# Patient Record
Sex: Male | Born: 1950
Health system: Southern US, Community
[De-identification: ages and names within clinical notes are randomized; demographics above are authoritative.]

## PROBLEM LIST (undated history)

## (undated) DIAGNOSIS — E7800 Pure hypercholesterolemia, unspecified: Secondary | ICD-10-CM

## (undated) DIAGNOSIS — G43909 Migraine, unspecified, not intractable, without status migrainosus: Secondary | ICD-10-CM

## (undated) DIAGNOSIS — Z87442 Personal history of urinary calculi: Secondary | ICD-10-CM

## (undated) DIAGNOSIS — I10 Essential (primary) hypertension: Secondary | ICD-10-CM

## (undated) HISTORY — PX: LITHOTRIPSY: SUR834

## (undated) HISTORY — PX: HERNIA REPAIR: SHX51

---

## 2001-01-26 ENCOUNTER — Encounter: Payer: Self-pay | Admitting: Emergency Medicine

## 2001-01-26 ENCOUNTER — Emergency Department (HOSPITAL_COMMUNITY): Admission: EM | Admit: 2001-01-26 | Discharge: 2001-01-26 | Payer: Self-pay | Admitting: Emergency Medicine

## 2007-12-26 ENCOUNTER — Encounter: Admission: RE | Admit: 2007-12-26 | Discharge: 2007-12-26 | Payer: Self-pay | Admitting: Family Medicine

## 2015-03-01 ENCOUNTER — Other Ambulatory Visit: Payer: Self-pay | Admitting: Urology

## 2015-03-01 DIAGNOSIS — N50811 Right testicular pain: Secondary | ICD-10-CM

## 2015-03-01 DIAGNOSIS — R109 Unspecified abdominal pain: Secondary | ICD-10-CM

## 2015-03-08 ENCOUNTER — Ambulatory Visit
Admission: RE | Admit: 2015-03-08 | Discharge: 2015-03-08 | Disposition: A | Discharge status: Home / Self Care | Payer: 59 | Source: Ambulatory Visit | Attending: Urology | Admitting: Urology

## 2015-03-08 DIAGNOSIS — R109 Unspecified abdominal pain: Secondary | ICD-10-CM

## 2015-03-08 DIAGNOSIS — N50811 Right testicular pain: Secondary | ICD-10-CM

## 2015-03-08 MED ORDER — IOPAMIDOL (ISOVUE-300) INJECTION 61%
100.0000 mL | Freq: Once | INTRAVENOUS | Status: AC | PRN
  Administered 2015-03-08: 100 mL via INTRAVENOUS

## 2015-08-21 HISTORY — PX: CYSTOSCOPY W/ STONE MANIPULATION: SHX1427

## 2015-10-14 ENCOUNTER — Other Ambulatory Visit: Payer: Self-pay | Admitting: Physician Assistant

## 2015-10-14 ENCOUNTER — Ambulatory Visit
Admission: RE | Admit: 2015-10-14 | Discharge: 2015-10-14 | Disposition: A | Discharge status: Home / Self Care | Payer: 59 | Source: Ambulatory Visit | Attending: Physician Assistant | Admitting: Physician Assistant

## 2015-10-14 DIAGNOSIS — R059 Cough, unspecified: Secondary | ICD-10-CM

## 2015-10-14 DIAGNOSIS — R509 Fever, unspecified: Secondary | ICD-10-CM

## 2015-10-14 DIAGNOSIS — R05 Cough: Secondary | ICD-10-CM

## 2016-01-30 ENCOUNTER — Ambulatory Visit: Payer: Self-pay | Admitting: Surgery

## 2016-01-30 NOTE — H&P (Signed)
History of Present Illness Jerry [name]. Arnoldo Hildreth MD; 01/30/2016 11:13 AM) The patient is a 65 year old male who presents with an inguinal hernia. Referred by Dr. Donnie Coffin for left inguinal hernia  This is a 64 year old male in good health who was recently working and is born. He was lifting a sheet of plywood by himself. The next day he noticed some mild discomfort and some bulging in his left groin. This remains reducible. He denies any obstructive symptoms. This has not interfered with his level of activity. He was examined by Dr. Alroy Dust who felt that he probably had a left inguinal hernia. He presents now for surgical evaluation.   Other Problems Elbert Ewings, CMA; 01/30/2016 9:19 AM) Enlarged Prostate Hemorrhoids High blood pressure Hypercholesterolemia Inguinal Hernia Kidney Stone  Past Surgical History Elbert Ewings, CMA; 01/30/2016 9:19 AM) Vasectomy  Diagnostic Studies History Elbert Ewings, CMA; 01/30/2016 9:19 AM) Colonoscopy 1-5 years ago  Allergies Elbert Ewings, CMA; 01/30/2016 9:20 AM) Ciprofloxacin HCl *CHEMICALS*  Medication History Elbert Ewings, CMA; 01/30/2016 9:21 AM) Lisinopril-Hydrochlorothiazide (20-12.5MG  Tablet, Oral) Active. Lipitor (40MG  Tablet, Oral) Active. Verapamil HCl ER (240MG  Capsule ER 24HR, Oral) Active. Medications Reconciled  Social History Elbert Ewings, [place]; 01/30/2016 9:19 AM) Alcohol use Moderate alcohol use. Caffeine use Coffee. No drug use Tobacco use Former smoker.  Family History Elbert Ewings, [place]; 01/30/2016 9:19 AM) Cancer Mother. Heart Disease Father. Hypertension Father. Kidney Disease Father.     Review of Systems Elbert Ewings CMA; 01/30/2016 9:19 AM) General Not Present- Appetite Loss, Chills, Fatigue, Fever, Night Sweats, Weight Gain and Weight Loss. Skin Not Present- Change in Wart/Mole, Dryness, Hives, Jaundice, New Lesions, Non-Healing Wounds, Rash and Ulcer. HEENT Present- Hearing Loss, Ringing  in the Ears, Seasonal Allergies and Wears glasses/contact lenses. Not Present- Earache, Hoarseness, Nose Bleed, Oral Ulcers, Sinus Pain, Sore Throat, Visual Disturbances and Yellow Eyes. Respiratory Not Present- Bloody sputum, Chronic Cough, Difficulty Breathing, Snoring and Wheezing. Breast Not Present- Breast Mass, Breast Pain, Nipple Discharge and Skin Changes. Cardiovascular Not Present- Chest Pain, Difficulty Breathing Lying Down, Leg Cramps, Palpitations, Rapid Heart Rate, Shortness of Breath and Swelling of Extremities. Gastrointestinal Present- Hemorrhoids. Not Present- Abdominal Pain, Bloating, Bloody Stool, Change in Bowel Habits, Chronic diarrhea, Constipation, Difficulty Swallowing, Excessive gas, Gets full quickly at meals, Indigestion, Nausea, Rectal Pain and Vomiting. Male Genitourinary Present- Frequency. Not Present- Blood in Urine, Change in Urinary Stream, Impotence, Nocturia, Painful Urination, Urgency and Urine Leakage. Musculoskeletal Not Present- Back Pain, Joint Pain, Joint Stiffness, Muscle Pain, Muscle Weakness and Swelling of Extremities. Neurological Not Present- Decreased Memory, Fainting, Headaches, Numbness, Seizures, Tingling, Tremor, Trouble walking and Weakness. Psychiatric Not Present- Anxiety, Bipolar, Change in Sleep Pattern, Depression, Fearful and Frequent crying. Endocrine Not Present- Cold Intolerance, Excessive Hunger, Hair Changes, Heat Intolerance, Hot flashes and New Diabetes. Hematology Not Present- Easy Bruising, Excessive bleeding, Gland problems, HIV and Persistent Infections.  Vitals Elbert Ewings CMA; 01/30/2016 9:21 AM) 01/30/2016 9:21 AM Weight: 180 lb Height: 71in Body Surface Area: 2.02 m Body Mass Index: 25.1 kg/m  Temp.: 97.29F  Pulse: 72 (Regular)  BP: 132/84 (Sitting, Left Arm, Standard)      Physical Exam Rodman Key K. Azzie Thiem MD; 01/30/2016 11:14 AM)  The physical exam findings are as follows: Note:WDWN in NAD HEENT: EOMI,  sclera anicteric Neck: No masses, no thyromegaly Lungs: CTA bilaterally; normal respiratory effort CV: Regular rate and rhythm; no murmurs Abd: +bowel sounds, soft, non-tender, no masses GU: bilateral descended testes; no testicular masses; visible reducible left inguinal hernia;  no sign of right inguinal hernia Ext: Well-perfused; no edema Skin: Warm, dry; no sign of jaundice    Assessment & Plan Rodman Key K. Makinzie Considine MD; 01/30/2016 9:47 AM)  INGUINAL HERNIA OF LEFT SIDE WITHOUT OBSTRUCTION OR GANGRENE (K40.90)  Current Plans Pt Education - Pamphlet Given - Hernia Surgery: discussed with patient and provided information. Schedule for Surgery - Left inguinal hernia repair with mesh. The surgical procedure has been discussed with the patient. Potential risks, benefits, alternative treatments, and expected outcomes have been explained. All of the patient's questions at this time have been answered. The likelihood of reaching the patient's treatment goal is good. The patient understand the proposed surgical procedure and wishes to proceed.  Jerry [name]. Georgette Dover, MD, [facility]  General/ Trauma Surgery  01/30/2016 11:15 AM

## 2016-02-28 ENCOUNTER — Encounter (HOSPITAL_COMMUNITY): Payer: Self-pay

## 2016-02-28 ENCOUNTER — Encounter (HOSPITAL_COMMUNITY)
Admission: RE | Admit: 2016-02-28 | Discharge: 2016-02-28 | Disposition: A | Discharge status: Home / Self Care | Payer: 59 | Source: Ambulatory Visit | Attending: Surgery | Admitting: Surgery

## 2016-02-28 DIAGNOSIS — K4090 Unilateral inguinal hernia, without obstruction or gangrene, not specified as recurrent: Secondary | ICD-10-CM | POA: Diagnosis not present

## 2016-02-28 DIAGNOSIS — Z01812 Encounter for preprocedural laboratory examination: Secondary | ICD-10-CM | POA: Diagnosis not present

## 2016-02-28 DIAGNOSIS — Z01810 Encounter for preprocedural cardiovascular examination: Secondary | ICD-10-CM | POA: Insufficient documentation

## 2016-02-28 DIAGNOSIS — R9431 Abnormal electrocardiogram [ECG] [EKG]: Secondary | ICD-10-CM | POA: Diagnosis not present

## 2016-02-28 HISTORY — DX: Personal history of urinary calculi: Z87.442

## 2016-02-28 HISTORY — DX: Essential (primary) hypertension: I10

## 2016-02-28 LAB — CBC
HEMATOCRIT: 47.6 % (ref 39.0–52.0)
HEMOGLOBIN: 15.8 g/dL (ref 13.0–17.0)
MCH: 29.5 pg (ref 26.0–34.0)
MCHC: 33.2 g/dL (ref 30.0–36.0)
MCV: 88.8 fL (ref 78.0–100.0)
Platelets: 204 10*3/uL (ref 150–400)
RBC: 5.36 MIL/uL (ref 4.22–5.81)
RDW: 13.1 % (ref 11.5–15.5)
WBC: 6.3 10*3/uL (ref 4.0–10.5)

## 2016-02-28 LAB — BASIC METABOLIC PANEL
Anion gap: 7 (ref 5–15)
BUN: 21 mg/dL — AB (ref 6–20)
CHLORIDE: 108 mmol/L (ref 101–111)
CO2: 24 mmol/L (ref 22–32)
CREATININE: 0.97 mg/dL (ref 0.61–1.24)
Calcium: 9.5 mg/dL (ref 8.9–10.3)
GFR calc non Af Amer: >60 mL/min (ref >60)
Glucose, Bld: 98 mg/dL (ref 65–99)
POTASSIUM: 4.1 mmol/L (ref 3.5–5.1)
SODIUM: 139 mmol/L (ref 135–145)

## 2016-02-28 NOTE — Pre-Procedure Instructions (Addendum)
Jerry [name]  02/28/2016      [name] - [place], [place] [address] Phone: [phone] Fax: [phone]    Your procedure is scheduled on [address]  Report to [name] at 630 A.M.  Call this number if you have problems the morning of surgery:  [phone]   Remember:  Do not eat food or drink liquids after midnight.  Take these medicines the morning of surgery with A SIP OF WATER verapamil  STOP all herbel meds, nsaids (aleve,naproxen,advil,ibuprofen) 5 days prior to surgery starting 03/02/18 including all vitamins(magnesium, omega fish oil, multi) diclofenac, aspirin   Do not wear jewelry, make-up or nail polish.  Do not wear lotions, powders, or perfumes.  You may wear deoderant.  Do not shave 48 hours prior to surgery.  Men may shave face and neck.  Do not bring valuables to the hospital.  [name] is not responsible for any belongings or valuables.  Contacts, dentures or bridgework may not be worn into surgery.  Leave your suitcase in the car.  After surgery it may be brought to your room.  For patients admitted to the hospital, discharge time will be determined by your treatment team.  Patients discharged the day of surgery will not be allowed to drive home.   Name and phone number of your driver: Special instructions:   Special Instructions: [name] - Preparing for Surgery  Before surgery, you can play an important role.  Because skin is not sterile, your skin needs to be as free of germs as possible.  You can reduce the number of germs on you skin by washing with CHG (chlorahexidine gluconate) soap before surgery.  CHG is an antiseptic cleaner which kills germs and bonds with the skin to continue killing germs even after washing.  Please DO NOT use if you have an allergy to CHG or antibacterial soaps.  If your skin becomes reddened/irritated stop using the CHG and inform your nurse  when you arrive at Short Stay.  Do not shave (including legs and underarms) for at least 48 hours prior to the first CHG shower.  You may shave your face.  Please follow these instructions carefully:   1.  Shower with CHG Soap the night before surgery and the morning of Surgery.  2.  If you choose to wash your hair, wash your hair first as usual with your normal shampoo.  3.  After you shampoo, rinse your hair and body thoroughly to remove the Shampoo.  4.  Use CHG as you would any other liquid soap.  You can apply chg directly  to the skin and wash gently with scrungie or a clean washcloth.  5.  Apply the CHG Soap to your body ONLY FROM THE NECK DOWN.  Do not use on open wounds or open sores.  Avoid contact with your eyes ears, mouth and genitals (private parts).  Wash genitals (private parts)       with your normal soap.  6.  Wash thoroughly, paying special attention to the area where your surgery will be performed.  7.  Thoroughly rinse your body with warm water from the neck down.  8.  DO NOT shower/wash with your normal soap after using and rinsing off the CHG Soap.  9.  Pat yourself dry with a clean towel.            10.  Wear  clean pajamas.            11.  Place clean sheets on your bed the night of your first shower and do not sleep with pets.  Day of Surgery  Do not apply any lotions/deodorants the morning of surgery.  Please wear clean clothes to the hospital/surgery center.  Please read over the following fact sheets that you were given. Pain Booklet and Surgical Site Infection Prevention

## 2016-03-07 ENCOUNTER — Ambulatory Visit (HOSPITAL_COMMUNITY)
Admission: RE | Admit: 2016-03-07 | Discharge: 2016-03-07 | Disposition: A | Discharge status: Home / Self Care | Payer: 59 | Source: Ambulatory Visit | Attending: Surgery | Admitting: Surgery

## 2016-03-07 ENCOUNTER — Ambulatory Visit (HOSPITAL_COMMUNITY): Payer: 59 | Admitting: Certified Registered Nurse Anesthetist

## 2016-03-07 ENCOUNTER — Encounter (HOSPITAL_COMMUNITY): Payer: Self-pay | Admitting: Surgery

## 2016-03-07 ENCOUNTER — Encounter (HOSPITAL_COMMUNITY)
Admission: RE | Disposition: A | Discharge status: Home / Self Care | Payer: Self-pay | Source: Ambulatory Visit | Attending: Surgery

## 2016-03-07 DIAGNOSIS — Z87891 Personal history of nicotine dependence: Secondary | ICD-10-CM | POA: Diagnosis not present

## 2016-03-07 DIAGNOSIS — I10 Essential (primary) hypertension: Secondary | ICD-10-CM | POA: Diagnosis not present

## 2016-03-07 DIAGNOSIS — K4090 Unilateral inguinal hernia, without obstruction or gangrene, not specified as recurrent: Secondary | ICD-10-CM | POA: Insufficient documentation

## 2016-03-07 DIAGNOSIS — E7800 Pure hypercholesterolemia, unspecified: Secondary | ICD-10-CM | POA: Insufficient documentation

## 2016-03-07 DIAGNOSIS — E785 Hyperlipidemia, unspecified: Secondary | ICD-10-CM | POA: Diagnosis not present

## 2016-03-07 HISTORY — PX: INSERTION OF MESH: SHX5868

## 2016-03-07 HISTORY — PX: INGUINAL HERNIA REPAIR: SHX194

## 2016-03-07 SURGERY — REPAIR, HERNIA, INGUINAL, ADULT
Anesthesia: Regional | Site: Groin | Laterality: Left

## 2016-03-07 MED ORDER — EPHEDRINE 5 MG/ML INJ
INTRAVENOUS | Status: AC
  Filled 2016-03-07: qty 10

## 2016-03-07 MED ORDER — CHLORHEXIDINE GLUCONATE CLOTH 2 % EX PADS: 6.0000 | MEDICATED_PAD | Freq: Once | CUTANEOUS | Status: DC

## 2016-03-07 MED ORDER — SUCCINYLCHOLINE CHLORIDE 200 MG/10ML IV SOSY
PREFILLED_SYRINGE | INTRAVENOUS | Status: AC
  Filled 2016-03-07: qty 10

## 2016-03-07 MED ORDER — BUPIVACAINE-EPINEPHRINE (PF) 0.25% -1:200000 IJ SOLN
INTRAMUSCULAR | Status: AC
  Filled 2016-03-07: qty 30

## 2016-03-07 MED ORDER — MORPHINE SULFATE (PF) 2 MG/ML IV SOLN: 2.0000 mg | INTRAVENOUS | Status: DC | PRN

## 2016-03-07 MED ORDER — LACTATED RINGERS IV SOLN
INTRAVENOUS | Status: DC | PRN
  Administered 2016-03-07 (×2): via INTRAVENOUS

## 2016-03-07 MED ORDER — LIDOCAINE HCL (CARDIAC) 20 MG/ML IV SOLN
INTRAVENOUS | Status: DC | PRN
  Administered 2016-03-07: 100 mg via INTRAVENOUS

## 2016-03-07 MED ORDER — LIDOCAINE 2% (20 MG/ML) 5 ML SYRINGE
INTRAMUSCULAR | Status: AC
  Filled 2016-03-07: qty 5

## 2016-03-07 MED ORDER — CEFAZOLIN SODIUM-DEXTROSE 2-4 GM/100ML-% IV SOLN
2.0000 g | INTRAVENOUS | Status: AC
  Administered 2016-03-07: 2 g via INTRAVENOUS

## 2016-03-07 MED ORDER — MIDAZOLAM HCL 2 MG/2ML IJ SOLN
INTRAMUSCULAR | Status: AC
  Filled 2016-03-07: qty 2

## 2016-03-07 MED ORDER — FENTANYL CITRATE (PF) 100 MCG/2ML IJ SOLN
INTRAMUSCULAR | Status: DC | PRN
  Administered 2016-03-07: 50 ug via INTRAVENOUS
  Administered 2016-03-07: 100 ug via INTRAVENOUS
  Administered 2016-03-07 (×2): 50 ug via INTRAVENOUS

## 2016-03-07 MED ORDER — BUPIVACAINE-EPINEPHRINE 0.25% -1:200000 IJ SOLN
INTRAMUSCULAR | Status: DC | PRN
  Administered 2016-03-07: 10 mL

## 2016-03-07 MED ORDER — PROPOFOL 10 MG/ML IV BOLUS
INTRAVENOUS | Status: AC
  Filled 2016-03-07: qty 20

## 2016-03-07 MED ORDER — FENTANYL CITRATE (PF) 100 MCG/2ML IJ SOLN: 25.0000 ug | INTRAMUSCULAR | Status: DC | PRN

## 2016-03-07 MED ORDER — HYDROCODONE-ACETAMINOPHEN 5-325 MG PO TABS: 1.0000 | ORAL_TABLET | ORAL | Status: DC | PRN

## 2016-03-07 MED ORDER — ROCURONIUM BROMIDE 50 MG/5ML IV SOLN
INTRAVENOUS | Status: AC
  Filled 2016-03-07: qty 1

## 2016-03-07 MED ORDER — FENTANYL CITRATE (PF) 250 MCG/5ML IJ SOLN
INTRAMUSCULAR | Status: AC
  Filled 2016-03-07: qty 5

## 2016-03-07 MED ORDER — MIDAZOLAM HCL 5 MG/5ML IJ SOLN
INTRAMUSCULAR | Status: DC | PRN
  Administered 2016-03-07: 2 mg via INTRAVENOUS

## 2016-03-07 MED ORDER — ONDANSETRON HCL 4 MG/2ML IJ SOLN
INTRAMUSCULAR | Status: AC
  Filled 2016-03-07: qty 2

## 2016-03-07 MED ORDER — PROMETHAZINE HCL 25 MG/ML IJ SOLN: 6.2500 mg | INTRAMUSCULAR | Status: DC | PRN

## 2016-03-07 MED ORDER — GABAPENTIN 300 MG PO CAPS
ORAL_CAPSULE | ORAL | Status: AC
  Administered 2016-03-07: 300 mg via ORAL
  Filled 2016-03-07: qty 1

## 2016-03-07 MED ORDER — ONDANSETRON HCL 4 MG/2ML IJ SOLN
INTRAMUSCULAR | Status: DC | PRN
  Administered 2016-03-07: 4 mg via INTRAVENOUS

## 2016-03-07 MED ORDER — CELECOXIB 200 MG PO CAPS
ORAL_CAPSULE | ORAL | Status: AC
  Administered 2016-03-07: 400 mg via ORAL
  Filled 2016-03-07: qty 1

## 2016-03-07 MED ORDER — PROPOFOL 10 MG/ML IV BOLUS
INTRAVENOUS | Status: DC | PRN
  Administered 2016-03-07: 200 mg via INTRAVENOUS

## 2016-03-07 MED ORDER — ONDANSETRON HCL 4 MG/2ML IJ SOLN
4.0000 mg | INTRAMUSCULAR | Status: DC | PRN
  Filled 2016-03-07: qty 2

## 2016-03-07 MED ORDER — CELECOXIB 200 MG PO CAPS
ORAL_CAPSULE | ORAL | Status: AC
  Filled 2016-03-07: qty 1

## 2016-03-07 MED ORDER — EPHEDRINE SULFATE 50 MG/ML IJ SOLN
INTRAMUSCULAR | Status: DC | PRN
  Administered 2016-03-07 (×2): 5 mg via INTRAVENOUS

## 2016-03-07 MED ORDER — CELECOXIB 200 MG PO CAPS
400.0000 mg | ORAL_CAPSULE | ORAL | Status: AC
  Administered 2016-03-07: 400 mg via ORAL

## 2016-03-07 MED ORDER — BUPIVACAINE-EPINEPHRINE (PF) 0.5% -1:200000 IJ SOLN
INTRAMUSCULAR | Status: DC | PRN
  Administered 2016-03-07: 30 mL via PERINEURAL

## 2016-03-07 MED ORDER — ACETAMINOPHEN 500 MG PO TABS
1000.0000 mg | ORAL_TABLET | ORAL | Status: AC
  Administered 2016-03-07: 1000 mg via ORAL
  Filled 2016-03-07: qty 2

## 2016-03-07 MED ORDER — GABAPENTIN 300 MG PO CAPS
300.0000 mg | ORAL_CAPSULE | ORAL | Status: AC
  Administered 2016-03-07: 300 mg via ORAL

## 2016-03-07 MED ORDER — PHENYLEPHRINE HCL 10 MG/ML IJ SOLN
INTRAMUSCULAR | Status: DC | PRN
  Administered 2016-03-07 (×2): 80 ug via INTRAVENOUS

## 2016-03-07 MED ORDER — CEFAZOLIN SODIUM-DEXTROSE 2-4 GM/100ML-% IV SOLN
INTRAVENOUS | Status: AC
  Filled 2016-03-07: qty 100

## 2016-03-07 SURGICAL SUPPLY — 54 items
APL SKNCLS STERI-STRIP NONHPOA (GAUZE/BANDAGES/DRESSINGS) ×1
BENZOIN TINCTURE PRP APPL 2/3 (GAUZE/BANDAGES/DRESSINGS) ×3 IMPLANT
BLADE SURG 15 STRL LF DISP TIS (BLADE) ×1 IMPLANT
BLADE SURG 15 STRL SS (BLADE) ×3
BLADE SURG ROTATE 9660 (MISCELLANEOUS) ×2 IMPLANT
CHLORAPREP W/TINT 26ML (MISCELLANEOUS) ×3 IMPLANT
CLOSURE WOUND 1/2 X4 (GAUZE/BANDAGES/DRESSINGS) ×1
COVER SURGICAL LIGHT HANDLE (MISCELLANEOUS) ×3 IMPLANT
DRAIN PENROSE 1/2X12 LTX STRL (WOUND CARE) ×2 IMPLANT
DRAPE LAPAROSCOPIC ABDOMINAL (DRAPES) IMPLANT
DRAPE LAPAROTOMY TRNSV 102X78 (DRAPE) ×2 IMPLANT
DRAPE UTILITY XL STRL (DRAPES) ×3 IMPLANT
DRSG TEGADERM 4X4.75 (GAUZE/BANDAGES/DRESSINGS) ×3 IMPLANT
ELECT CAUTERY BLADE 6.4 (BLADE) ×3 IMPLANT
ELECT REM PT RETURN 9FT ADLT (ELECTROSURGICAL) ×3
ELECTRODE REM PT RTRN 9FT ADLT (ELECTROSURGICAL) ×1 IMPLANT
GAUZE SPONGE 4X4 12PLY STRL (GAUZE/BANDAGES/DRESSINGS) ×3 IMPLANT
GAUZE SPONGE 4X4 16PLY XRAY LF (GAUZE/BANDAGES/DRESSINGS) ×3 IMPLANT
GLOVE BIO SURGEON STRL SZ7 (GLOVE) ×3 IMPLANT
GLOVE BIO SURGEON STRL SZ8 (GLOVE) ×2 IMPLANT
GLOVE BIOGEL PI IND STRL 6.5 (GLOVE) IMPLANT
GLOVE BIOGEL PI IND STRL 7.5 (GLOVE) ×1 IMPLANT
GLOVE BIOGEL PI IND STRL 8.5 (GLOVE) IMPLANT
GLOVE BIOGEL PI INDICATOR 6.5 (GLOVE) ×2
GLOVE BIOGEL PI INDICATOR 7.5 (GLOVE) ×2
GLOVE BIOGEL PI INDICATOR 8.5 (GLOVE) ×2
GOWN STRL REUS W/ TWL LRG LVL3 (GOWN DISPOSABLE) ×2 IMPLANT
GOWN STRL REUS W/ TWL XL LVL3 (GOWN DISPOSABLE) IMPLANT
GOWN STRL REUS W/TWL LRG LVL3 (GOWN DISPOSABLE) ×3
GOWN STRL REUS W/TWL XL LVL3 (GOWN DISPOSABLE) ×3
KIT BASIN OR (CUSTOM PROCEDURE TRAY) ×3 IMPLANT
KIT ROOM TURNOVER OR (KITS) ×3 IMPLANT
MESH PARIETEX PROGRIP LEFT (Mesh General) ×2 IMPLANT
NDL HYPO 25GX1X1/2 BEV (NEEDLE) ×1 IMPLANT
NEEDLE HYPO 25GX1X1/2 BEV (NEEDLE) ×3 IMPLANT
NS IRRIG 1000ML POUR BTL (IV SOLUTION) ×3 IMPLANT
PACK SURGICAL SETUP 50X90 (CUSTOM PROCEDURE TRAY) ×3 IMPLANT
PAD ARMBOARD 7.5X6 YLW CONV (MISCELLANEOUS) ×3 IMPLANT
PENCIL BUTTON HOLSTER BLD 10FT (ELECTRODE) ×3 IMPLANT
SPONGE INTESTINAL PEANUT (DISPOSABLE) ×3 IMPLANT
STRIP CLOSURE SKIN 1/2X4 (GAUZE/BANDAGES/DRESSINGS) ×2 IMPLANT
SUT MNCRL AB 4-0 PS2 18 (SUTURE) ×3 IMPLANT
SUT PDS AB 0 CT 36 (SUTURE) IMPLANT
SUT SILK 2 0 SH (SUTURE) IMPLANT
SUT SILK 3 0 (SUTURE)
SUT SILK 3-0 18XBRD TIE 12 (SUTURE) IMPLANT
SUT VIC AB 0 CT2 27 (SUTURE) ×3 IMPLANT
SUT VIC AB 2-0 SH 27 (SUTURE) ×3
SUT VIC AB 2-0 SH 27X BRD (SUTURE) ×1 IMPLANT
SUT VIC AB 3-0 SH 27 (SUTURE) ×3
SUT VIC AB 3-0 SH 27XBRD (SUTURE) ×1 IMPLANT
SYR CONTROL 10ML LL (SYRINGE) ×3 IMPLANT
TOWEL OR 17X24 6PK STRL BLUE (TOWEL DISPOSABLE) ×3 IMPLANT
TOWEL OR 17X26 10 PK STRL BLUE (TOWEL DISPOSABLE) ×3 IMPLANT

## 2016-03-07 NOTE — Anesthesia Procedure Notes (Addendum)
Anesthesia Regional Block:  TAP block  Pre-Anesthetic Checklist: ,, timeout performed, Correct Patient, Correct Site, Correct Laterality, Correct Procedure, Correct Position, site marked, Risks and benefits discussed,  Surgical consent,  Pre-op evaluation,  At surgeon's request and post-op pain management  Laterality: Left  Prep: chloraprep       Needles:  Injection technique: Single-shot  Needle Type: Echogenic Needle     Needle Length: 9cm 9 cm Needle Gauge: 21 and 21 G    Additional Needles:  Procedures: ultrasound guided (picture in chart) TAP block Narrative:  Injection made incrementally with aspirations every 5 mL.  Performed by: Personally  Anesthesiologist: Catalina Gravel  Additional Notes: No pain on injection. No increased resistance to injection. Injection made in 5cc increments.  Good needle visualization.  Patient tolerated procedure well.   Procedure Name: LMA Insertion Date/Time: 03/07/2016 8:39 AM Performed by: Shirlyn Goltz Pre-anesthesia Checklist: Patient identified, Emergency Drugs available, Suction available and Patient being monitored Patient Re-evaluated:Patient Re-evaluated prior to inductionOxygen Delivery Method: Circle system utilized Preoxygenation: Pre-oxygenation with 100% oxygen Intubation Type: IV induction Ventilation: Mask ventilation without difficulty LMA: LMA inserted LMA Size: 5.0 Number of attempts: 1 Placement Confirmation: positive ETCO2 and breath sounds checked- equal and bilateral Tube secured with: Tape Dental Injury: Teeth and Oropharynx as per pre-operative assessment

## 2016-03-07 NOTE — Transfer of Care (Signed)
Immediate Anesthesia Transfer of Care Note  Patient: Jerry [name]  Procedure(s) Performed: Procedure(s): LEFT INGUINAL HERNIA REPAIR (Left) INSERTION OF MESH (Left)  Patient Location: PACU  Anesthesia Type:General  Level of Consciousness: awake, alert , oriented and patient cooperative  Airway & Oxygen Therapy: Patient Spontanous Breathing and Patient connected to nasal cannula oxygen  Post-op Assessment: Report given to RN and Post -op Vital signs reviewed and stable  Post vital signs: Reviewed and stable  Last Vitals:  Filed Vitals:   03/07/16 0725  BP: 154/79  Pulse: 60  Temp: 37.1 C  Resp: 20    Last Pain: There were no vitals filed for this visit.    Patients Stated Pain Goal: 6 ([id number])  Complications: No apparent anesthesia complications

## 2016-03-07 NOTE — Anesthesia Preprocedure Evaluation (Addendum)
Anesthesia Evaluation  Patient identified by MRN, date of birth, ID band Patient awake    Reviewed: Allergy & Precautions, NPO status , Patient's Chart, lab work & pertinent test results  History of Anesthesia Complications Negative for: history of anesthetic complications  Airway Mallampati: I  TM Distance: >3 FB Neck ROM: Full    Dental  (+) Teeth Intact, Dental Advisory Given   Pulmonary former smoker,    Pulmonary exam normal breath sounds clear to auscultation       Cardiovascular Exercise Tolerance: Good hypertension, Pt. on medications (-) angina(-) CAD and (-) Past MI Normal cardiovascular exam Rhythm:Regular Rate:Normal  HLD   Neuro/Psych negative neurological ROS  negative psych ROS   GI/Hepatic negative GI ROS, Neg liver ROS,   Endo/Other  negative endocrine ROS  Renal/GU negative Renal ROS     Musculoskeletal negative musculoskeletal ROS (+)   Abdominal   Peds  Hematology negative hematology ROS (+)   Anesthesia Other Findings Day of surgery medications reviewed with the patient.  Reproductive/Obstetrics                            Anesthesia Physical Anesthesia Plan  ASA: II  Anesthesia Plan: General and Regional   Post-op Pain Management:  Regional for Post-op pain   Induction: Intravenous  Airway Management Planned: Oral ETT  Additional Equipment:   Intra-op Plan:   Post-operative Plan: Extubation in OR  Informed Consent: I have reviewed the patients History and Physical, chart, labs and discussed the procedure including the risks, benefits and alternatives for the proposed anesthesia with the patient or authorized representative who has indicated his/her understanding and acceptance.   Dental advisory given  Plan Discussed with: CRNA  Anesthesia Plan Comments: (Risks/benefits of general anesthesia discussed with patient including risk of damage to teeth,  lips, gum, and tongue, nausea/vomiting, allergic reactions to medications, and the possibility of heart attack, stroke and death.  All patient questions answered.  Patient wishes to proceed.  Discussed risks and benefits of TAP block including failure, bleeding, infection, nerve damage. Discussed that the block may not prevent all of the pain. Questions answered. Patient consents to block. )        Anesthesia Quick Evaluation

## 2016-03-07 NOTE — Discharge Instructions (Signed)
[facility] ° ° INGUINAL HERNIA REPAIR: POST OP INSTRUCTIONS ° °Always review your discharge instruction sheet given to you by the facility where your surgery was performed. °IF YOU HAVE DISABILITY OR FAMILY LEAVE FORMS, YOU MUST BRING THEM TO THE OFFICE FOR PROCESSING.   °DO NOT GIVE THEM TO YOUR DOCTOR. ° °1. A  prescription for pain medication may be given to you upon discharge.  Take your pain medication as prescribed, if needed.  If narcotic pain medicine is not needed, then you may take acetaminophen (Tylenol) or ibuprofen (Advil) as needed. °2. Take your usually prescribed medications unless otherwise directed. °3. If you need a refill on your pain medication, please contact your pharmacy.  They will contact our office to request authorization. Prescriptions will not be filled after 5 pm or on week-ends. °4. You should follow a light diet the first 24 hours after arrival home, such as soup and crackers, etc.  Be sure to include lots of fluids daily.  Resume your normal diet the day after surgery. °5. Most patients will experience some swelling and bruising around the umbilicus or in the groin and scrotum.  Ice packs and reclining will help.  Swelling and bruising can take several days to resolve.  °6. It is common to experience some constipation if taking pain medication after surgery.  Increasing fluid intake and taking a stool softener (such as Colace) will usually help or prevent this problem from occurring.  A mild laxative (Milk of Magnesia or Miralax) should be taken according to package directions if there are no bowel movements after 48 hours. °7. Unless discharge instructions indicate otherwise, you may remove your bandages 24-48 hours after surgery, and you may shower at that time.  You will have steri-strips (small skin tapes) in place directly over the incision.  These strips should be left on the skin for 7-10 days. °8. ACTIVITIES:  You may resume regular (light) daily activities  beginning the next day--such as daily self-care, walking, climbing stairs--gradually increasing activities as tolerated.  You may have sexual intercourse when it is comfortable.  Refrain from any heavy lifting or straining until approved by your doctor. °a. You may drive when you are no longer taking prescription pain medication, you can comfortably wear a seatbelt, and you can safely maneuver your car and apply brakes. °b. RETURN TO WORK:  2-3 weeks with light duty - no lifting over 15 lbs. °9. You should see your doctor in the office for a follow-up appointment approximately 2-3 weeks after your surgery.  Make sure that you call for this appointment within a day or two after you arrive home to insure a convenient appointment time. °10. OTHER INSTRUCTIONS:  __________________________________________________________________________________________________________________________________________________________________________________________  °WHEN TO CALL YOUR DOCTOR: °1. Fever over 101.0 °2. Inability to urinate °3. Nausea and/or vomiting °4. Extreme swelling or bruising °5. Continued bleeding from incision. °6. Increased pain, redness, or drainage from the incision ° °The clinic staff is available to answer your questions during regular business hours.  Please don’t hesitate to call and ask to speak to one of the nurses for clinical concerns.  If you have a medical emergency, go to the nearest emergency room or call 911.  A surgeon from [facility] is always on call at the hospital ° ° °[address], [address], [place], [place]  [address] ? ° [address] [address], [place], [place]   [address] °[phone]    [phone]    FAX [phone] °Web site:   [email or web address] ° ° °

## 2016-03-07 NOTE — Op Note (Signed)
Hernia, Open, Procedure Note  Indications: The patient presented with a history of a left, reducible inguinal hernia.    Pre-operative Diagnosis: left reducible inguinal hernia Post-operative Diagnosis: same  Surgeon: Maia Petties.   Assistants: none  Anesthesia: General LMA anesthesia and TAP block  ASA Class: 2  Procedure Details  The patient was seen again in the Holding Room. The risks, benefits, complications, treatment options, and expected outcomes were discussed with the patient. The possibilities of reaction to medication, pulmonary aspiration, perforation of viscus, bleeding, recurrent infection, the need for additional procedures, and development of a complication requiring transfusion or further operation were discussed with the patient and/or family. The likelihood of success in repairing the hernia and returning the patient to their previous functional status is good.  There was concurrence with the proposed plan, and informed consent was obtained. The site of surgery was properly noted/marked. The patient was taken to the Operating Room, identified as Jerry [name], and the procedure verified as left inguinal hernia repair. A Time Out was held and the above information confirmed.  The patient was placed in the supine position and underwent induction of anesthesia. The lower abdomen and groin was prepped with Chloraprep and draped in the standard fashion, and 0.25% Marcaine with epinephrine was used to anesthetize the skin over the mid-portion of the inguinal canal. An oblique incision was made. Dissection was carried down through the subcutaneous tissue with cautery to the external oblique fascia.  We opened the external oblique fascia along the direction of its fibers to the external ring.  The spermatic cord was circumferentially dissected bluntly and retracted with a Penrose drain.  The ilioinguinal nerve was identified and preserved.  The floor of the inguinal canal was  inspected and showed a large direct hernia defect.  The hernia sac was dissected free and reduced. The floor of the inguinal canal was closed with 0 Vicryl.  We skeletonized the spermatic cord and reduced a small cord lipoma.  We used a left-sided Progrip mesh which was inserted and deployed across the floor of the inguinal canal. The mesh was tucked underneath the external oblique fascia laterally.  The flap of the mesh was closed around the spermatic cord to recreate the internal inguinal ring.  The mesh was secured to the pubic tubercle with 0 Vicryl.  The mesh flap was closed with 0 Vicryl and the lower edge of the mesh was attached to the shelving edge. The external oblique fascia was reapproximated with 2-0 Vicryl.  3-0 Vicryl was used to close the subcutaneous tissues and 4-0 Monocryl was used to close the skin in subcuticular fashion.  Benzoin and steri-strips were used to seal the incision.  A clean dressing was applied.  The patient was then extubated and brought to the recovery room in stable condition.  All sponge, instrument, and needle counts were correct prior to closure and at the conclusion of the case.   Estimated Blood Loss: Minimal                 Complications: None; patient tolerated the procedure well.         Disposition: PACU - hemodynamically stable.         Condition: stable  Imogene Burn. Georgette Dover, MD, [facility]  General/ Trauma Surgery  03/07/2016 9:40 AM

## 2016-03-07 NOTE — H&P (Signed)
History of Present Illness The patient is a 65 year old male who presents with an inguinal hernia. Referred by Dr. Donnie Coffin for left inguinal hernia  This is a 65 year old male in good health who was recently working and is born. He was lifting a sheet of plywood by himself. The next day he noticed some mild discomfort and some bulging in his left groin. This remains reducible. He denies any obstructive symptoms. This has not interfered with his level of activity. He was examined by Dr. Alroy Dust who felt that he probably had a left inguinal hernia. He presents now for surgical evaluation.   Other Problems  Enlarged Prostate Hemorrhoids High blood pressure Hypercholesterolemia Inguinal Hernia Kidney Stone  Past Surgical History  Vasectomy  Diagnostic Studies History Colonoscopy 1-5 years ago  Allergies  Ciprofloxacin HCl *CHEMICALS*  Medication History  Lisinopril-Hydrochlorothiazide (20-12.5MG  Tablet, Oral) Active. Lipitor (40MG  Tablet, Oral) Active. Verapamil HCl ER (240MG  Capsule ER 24HR, Oral) Active. Medications Reconciled  Social History  Alcohol use Moderate alcohol use. Caffeine use Coffee. No drug use Tobacco use Former smoker.  Family History  Cancer Mother. Heart Disease Father. Hypertension Father. Kidney Disease Father.     Review of Systems  General Not Present- Appetite Loss, Chills, Fatigue, Fever, Night Sweats, Weight Gain and Weight Loss. Skin Not Present- Change in Wart/Mole, Dryness, Hives, Jaundice, New Lesions, Non-Healing Wounds, Rash and Ulcer. HEENT Present- Hearing Loss, Ringing in the Ears, Seasonal Allergies and Wears glasses/contact lenses. Not Present- Earache, Hoarseness, Nose Bleed, Oral Ulcers, Sinus Pain, Sore Throat, Visual Disturbances and Yellow Eyes. Respiratory Not Present- Bloody sputum, Chronic Cough, Difficulty Breathing, Snoring and Wheezing. Breast Not Present- Breast Mass, Breast Pain,  Nipple Discharge and Skin Changes. Cardiovascular Not Present- Chest Pain, Difficulty Breathing Lying Down, Leg Cramps, Palpitations, Rapid Heart Rate, Shortness of Breath and Swelling of Extremities. Gastrointestinal Present- Hemorrhoids. Not Present- Abdominal Pain, Bloating, Bloody Stool, Change in Bowel Habits, Chronic diarrhea, Constipation, Difficulty Swallowing, Excessive gas, Gets full quickly at meals, Indigestion, Nausea, Rectal Pain and Vomiting. Male Genitourinary Present- Frequency. Not Present- Blood in Urine, Change in Urinary Stream, Impotence, Nocturia, Painful Urination, Urgency and Urine Leakage. Musculoskeletal Not Present- Back Pain, Joint Pain, Joint Stiffness, Muscle Pain, Muscle Weakness and Swelling of Extremities. Neurological Not Present- Decreased Memory, Fainting, Headaches, Numbness, Seizures, Tingling, Tremor, Trouble walking and Weakness. Psychiatric Not Present- Anxiety, Bipolar, Change in Sleep Pattern, Depression, Fearful and Frequent crying. Endocrine Not Present- Cold Intolerance, Excessive Hunger, Hair Changes, Heat Intolerance, Hot flashes and New Diabetes. Hematology Not Present- Easy Bruising, Excessive bleeding, Gland problems, HIV and Persistent Infections.  Vitals Weight: 180 lb Height: 71in Body Surface Area: 2.02 m Body Mass Index: 25.1 kg/m  Temp.: 97.54F  Pulse: 72 (Regular)  BP: 132/84 (Sitting, Left Arm, Standard)      Physical Exam   The physical exam findings are as follows: Note:WDWN in NAD HEENT: EOMI, sclera anicteric Neck: No masses, no thyromegaly Lungs: CTA bilaterally; normal respiratory effort CV: Regular rate and rhythm; no murmurs Abd: +bowel sounds, soft, non-tender, no masses GU: bilateral descended testes; no testicular masses; visible reducible left inguinal hernia; no sign of right inguinal hernia Ext: Well-perfused; no edema Skin: Warm, dry; no sign of jaundice    Assessment & Plan   INGUINAL  HERNIA OF LEFT SIDE WITHOUT OBSTRUCTION OR GANGRENE (K40.90)  Current Plans Pt Education - Pamphlet Given - Hernia Surgery: discussed with patient and provided information. Schedule for Surgery - Left inguinal hernia repair with mesh. The  surgical procedure has been discussed with the patient. Potential risks, benefits, alternative treatments, and expected outcomes have been explained. All of the patient's questions at this time have been answered. The likelihood of reaching the patient's treatment goal is good. The patient understand the proposed surgical procedure and wishes to proceed.  Imogene Burn. Georgette Dover, MD, [facility] Surgery  General/ Trauma Surgery  03/07/2016 7:54 AM

## 2016-03-07 NOTE — Anesthesia Postprocedure Evaluation (Signed)
Anesthesia Post Note  Patient: Jerry [name]  Procedure(s) Performed: Procedure(s) (LRB): LEFT INGUINAL HERNIA REPAIR (Left) INSERTION OF MESH (Left)  Patient location during evaluation: PACU Anesthesia Type: General and Regional Level of consciousness: awake and alert Pain management: pain level controlled Vital Signs Assessment: post-procedure vital signs reviewed and stable Respiratory status: spontaneous breathing, nonlabored ventilation, respiratory function stable and patient connected to nasal cannula oxygen Cardiovascular status: blood pressure returned to baseline and stable Postop Assessment: no signs of nausea or vomiting Anesthetic complications: no    Last Vitals:  Filed Vitals:   03/07/16 1045 03/07/16 1057  BP:  122/74  Pulse:  60  Temp: 36.6 C   Resp:      Last Pain:  Filed Vitals:   03/07/16 1102  PainSc: 2                  Catalina Gravel

## 2016-03-08 ENCOUNTER — Encounter (HOSPITAL_COMMUNITY): Payer: Self-pay | Admitting: Surgery

## 2017-02-13 DIAGNOSIS — N200 Calculus of kidney: Secondary | ICD-10-CM | POA: Diagnosis not present

## 2017-02-13 DIAGNOSIS — N400 Enlarged prostate without lower urinary tract symptoms: Secondary | ICD-10-CM | POA: Diagnosis not present

## 2017-06-03 DIAGNOSIS — Z23 Encounter for immunization: Secondary | ICD-10-CM | POA: Diagnosis not present

## 2017-06-20 DIAGNOSIS — I10 Essential (primary) hypertension: Secondary | ICD-10-CM | POA: Diagnosis not present

## 2017-06-20 DIAGNOSIS — Z23 Encounter for immunization: Secondary | ICD-10-CM | POA: Diagnosis not present

## 2017-06-20 DIAGNOSIS — E7800 Pure hypercholesterolemia, unspecified: Secondary | ICD-10-CM | POA: Diagnosis not present

## 2017-06-20 DIAGNOSIS — Z0000 Encounter for general adult medical examination without abnormal findings: Secondary | ICD-10-CM | POA: Diagnosis not present

## 2017-06-25 DIAGNOSIS — D1800 Hemangioma unspecified site: Secondary | ICD-10-CM | POA: Diagnosis not present

## 2017-06-25 DIAGNOSIS — L219 Seborrheic dermatitis, unspecified: Secondary | ICD-10-CM | POA: Diagnosis not present

## 2017-06-25 DIAGNOSIS — L570 Actinic keratosis: Secondary | ICD-10-CM | POA: Diagnosis not present

## 2017-10-28 DIAGNOSIS — B070 Plantar wart: Secondary | ICD-10-CM | POA: Diagnosis not present

## 2017-11-21 DIAGNOSIS — B070 Plantar wart: Secondary | ICD-10-CM | POA: Diagnosis not present

## 2017-12-18 DIAGNOSIS — I10 Essential (primary) hypertension: Secondary | ICD-10-CM | POA: Diagnosis not present

## 2017-12-18 DIAGNOSIS — B079 Viral wart, unspecified: Secondary | ICD-10-CM | POA: Diagnosis not present

## 2017-12-18 DIAGNOSIS — L719 Rosacea, unspecified: Secondary | ICD-10-CM | POA: Diagnosis not present

## 2017-12-18 DIAGNOSIS — E7800 Pure hypercholesterolemia, unspecified: Secondary | ICD-10-CM | POA: Diagnosis not present

## 2017-12-18 DIAGNOSIS — M542 Cervicalgia: Secondary | ICD-10-CM | POA: Diagnosis not present

## 2018-01-08 DIAGNOSIS — L570 Actinic keratosis: Secondary | ICD-10-CM | POA: Diagnosis not present

## 2018-01-08 DIAGNOSIS — D1800 Hemangioma unspecified site: Secondary | ICD-10-CM | POA: Diagnosis not present

## 2018-03-03 DIAGNOSIS — N200 Calculus of kidney: Secondary | ICD-10-CM | POA: Diagnosis not present

## 2018-03-03 DIAGNOSIS — N400 Enlarged prostate without lower urinary tract symptoms: Secondary | ICD-10-CM | POA: Diagnosis not present

## 2018-05-23 ENCOUNTER — Other Ambulatory Visit: Payer: Self-pay

## 2018-05-23 ENCOUNTER — Inpatient Hospital Stay (HOSPITAL_COMMUNITY)
Admission: EM | Admit: 2018-05-23 | Discharge: 2018-05-27 | DRG: 247 | Disposition: A | Discharge status: Home / Self Care | Payer: Medicare Other | Attending: Internal Medicine | Admitting: Internal Medicine

## 2018-05-23 ENCOUNTER — Emergency Department (HOSPITAL_COMMUNITY): Payer: Medicare Other

## 2018-05-23 ENCOUNTER — Encounter (HOSPITAL_COMMUNITY): Payer: Self-pay

## 2018-05-23 ENCOUNTER — Other Ambulatory Visit: Payer: Self-pay | Admitting: Student

## 2018-05-23 ENCOUNTER — Ambulatory Visit (HOSPITAL_COMMUNITY): Admit: 2018-05-23 | Payer: 59 | Admitting: Cardiovascular Disease

## 2018-05-23 ENCOUNTER — Observation Stay (HOSPITAL_BASED_OUTPATIENT_CLINIC_OR_DEPARTMENT_OTHER): Payer: Medicare Other

## 2018-05-23 DIAGNOSIS — I2510 Atherosclerotic heart disease of native coronary artery without angina pectoris: Secondary | ICD-10-CM | POA: Diagnosis present

## 2018-05-23 DIAGNOSIS — I10 Essential (primary) hypertension: Secondary | ICD-10-CM | POA: Diagnosis not present

## 2018-05-23 DIAGNOSIS — Z881 Allergy status to other antibiotic agents status: Secondary | ICD-10-CM

## 2018-05-23 DIAGNOSIS — R072 Precordial pain: Secondary | ICD-10-CM | POA: Diagnosis not present

## 2018-05-23 DIAGNOSIS — R7303 Prediabetes: Secondary | ICD-10-CM | POA: Diagnosis present

## 2018-05-23 DIAGNOSIS — Z79899 Other long term (current) drug therapy: Secondary | ICD-10-CM | POA: Diagnosis not present

## 2018-05-23 DIAGNOSIS — R001 Bradycardia, unspecified: Secondary | ICD-10-CM | POA: Diagnosis not present

## 2018-05-23 DIAGNOSIS — E785 Hyperlipidemia, unspecified: Secondary | ICD-10-CM | POA: Diagnosis present

## 2018-05-23 DIAGNOSIS — Z87891 Personal history of nicotine dependence: Secondary | ICD-10-CM | POA: Diagnosis not present

## 2018-05-23 DIAGNOSIS — I214 Non-ST elevation (NSTEMI) myocardial infarction: Secondary | ICD-10-CM

## 2018-05-23 DIAGNOSIS — G43909 Migraine, unspecified, not intractable, without status migrainosus: Secondary | ICD-10-CM | POA: Diagnosis present

## 2018-05-23 DIAGNOSIS — Z8249 Family history of ischemic heart disease and other diseases of the circulatory system: Secondary | ICD-10-CM

## 2018-05-23 DIAGNOSIS — R079 Chest pain, unspecified: Secondary | ICD-10-CM | POA: Diagnosis not present

## 2018-05-23 DIAGNOSIS — I200 Unstable angina: Secondary | ICD-10-CM | POA: Diagnosis not present

## 2018-05-23 DIAGNOSIS — R9431 Abnormal electrocardiogram [ECG] [EKG]: Secondary | ICD-10-CM | POA: Diagnosis not present

## 2018-05-23 DIAGNOSIS — Z888 Allergy status to other drugs, medicaments and biological substances status: Secondary | ICD-10-CM

## 2018-05-23 DIAGNOSIS — R0789 Other chest pain: Secondary | ICD-10-CM | POA: Diagnosis not present

## 2018-05-23 DIAGNOSIS — Z955 Presence of coronary angioplasty implant and graft: Secondary | ICD-10-CM

## 2018-05-23 DIAGNOSIS — R0602 Shortness of breath: Secondary | ICD-10-CM | POA: Diagnosis not present

## 2018-05-23 DIAGNOSIS — I5030 Unspecified diastolic (congestive) heart failure: Secondary | ICD-10-CM | POA: Diagnosis not present

## 2018-05-23 DIAGNOSIS — Z743 Need for continuous supervision: Secondary | ICD-10-CM | POA: Diagnosis not present

## 2018-05-23 HISTORY — DX: Migraine, unspecified, not intractable, without status migrainosus: G43.909

## 2018-05-23 HISTORY — DX: Pure hypercholesterolemia, unspecified: E78.00

## 2018-05-23 LAB — BASIC METABOLIC PANEL
ANION GAP: 8 (ref 5–15)
BUN: 17 mg/dL (ref 8–23)
CALCIUM: 9.3 mg/dL (ref 8.9–10.3)
CO2: 26 mmol/L (ref 22–32)
CREATININE: 1.06 mg/dL (ref 0.61–1.24)
Chloride: 103 mmol/L (ref 98–111)
GFR calc Af Amer: >60 mL/min (ref >60)
GFR calc non Af Amer: >60 mL/min (ref >60)
GLUCOSE: 180 mg/dL — AB (ref 70–99)
Potassium: 4 mmol/L (ref 3.5–5.1)
Sodium: 137 mmol/L (ref 135–145)

## 2018-05-23 LAB — TROPONIN I
TROPONIN I: 2.58 ng/mL — AB (ref <0.03)
Troponin I: <0.03 ng/mL (ref <0.03)

## 2018-05-23 LAB — CBC
HCT: 49.8 % (ref 39.0–52.0)
HEMOGLOBIN: 16.5 g/dL (ref 13.0–17.0)
MCH: 29.5 pg (ref 26.0–34.0)
MCHC: 33.1 g/dL (ref 30.0–36.0)
MCV: 89.1 fL (ref 78.0–100.0)
PLATELETS: 195 10*3/uL (ref 150–400)
RBC: 5.59 MIL/uL (ref 4.22–5.81)
RDW: 12.8 % (ref 11.5–15.5)
WBC: 6.4 10*3/uL (ref 4.0–10.5)

## 2018-05-23 LAB — I-STAT TROPONIN, ED: TROPONIN I, POC: 0.12 ng/mL — AB (ref 0.00–0.08)

## 2018-05-23 LAB — ECHOCARDIOGRAM COMPLETE
Height: 70 in
WEIGHTICAEL: 2800 [oz_av]

## 2018-05-23 SURGERY — LEFT HEART CATH AND CORONARY ANGIOGRAPHY
Anesthesia: LOCAL

## 2018-05-23 MED ORDER — HEPARIN BOLUS VIA INFUSION
4000.0000 [IU] | Freq: Once | INTRAVENOUS | Status: AC
  Administered 2018-05-23: 4000 [IU] via INTRAVENOUS
  Filled 2018-05-23: qty 4000

## 2018-05-23 MED ORDER — ONDANSETRON HCL 4 MG/2ML IJ SOLN: 4.0000 mg | Freq: Four times a day (QID) | INTRAMUSCULAR | Status: DC | PRN

## 2018-05-23 MED ORDER — MAGNESIUM 200 MG PO TABS
250.0000 mg | ORAL_TABLET | Freq: Two times a day (BID) | ORAL | Status: DC
  Filled 2018-05-23: qty 2

## 2018-05-23 MED ORDER — NITROGLYCERIN 2 % TD OINT
0.5000 [in_us] | TOPICAL_OINTMENT | Freq: Once | TRANSDERMAL | Status: AC
  Administered 2018-05-23: 0.5 [in_us] via TOPICAL
  Filled 2018-05-23: qty 1

## 2018-05-23 MED ORDER — ASPIRIN EC 325 MG PO TBEC
325.0000 mg | DELAYED_RELEASE_TABLET | Freq: Every day | ORAL | Status: DC
  Administered 2018-05-24 – 2018-05-25 (×2): 325 mg via ORAL
  Filled 2018-05-23 (×2): qty 1

## 2018-05-23 MED ORDER — VERAPAMIL HCL ER 120 MG PO TBCR: 120.0000 mg | EXTENDED_RELEASE_TABLET | ORAL | Status: DC

## 2018-05-23 MED ORDER — ATORVASTATIN CALCIUM 40 MG PO TABS
40.0000 mg | ORAL_TABLET | Freq: Every day | ORAL | Status: DC
  Administered 2018-05-23 – 2018-05-26 (×4): 40 mg via ORAL
  Filled 2018-05-23 (×4): qty 1

## 2018-05-23 MED ORDER — HEPARIN (PORCINE) IN NACL 100-0.45 UNIT/ML-% IJ SOLN
1000.0000 [IU]/h | INTRAMUSCULAR | Status: DC
  Administered 2018-05-23 – 2018-05-25 (×3): 1000 [IU]/h via INTRAVENOUS
  Filled 2018-05-23 (×3): qty 250

## 2018-05-23 MED ORDER — MAGNESIUM GLUCONATE 500 MG PO TABS
250.0000 mg | ORAL_TABLET | Freq: Two times a day (BID) | ORAL | Status: DC
  Administered 2018-05-24 – 2018-05-27 (×6): 250 mg via ORAL
  Filled 2018-05-23 (×9): qty 1

## 2018-05-23 MED ORDER — VERAPAMIL HCL ER 240 MG PO TBCR
240.0000 mg | EXTENDED_RELEASE_TABLET | Freq: Every morning | ORAL | Status: DC
  Administered 2018-05-24 – 2018-05-25 (×2): 240 mg via ORAL
  Filled 2018-05-23 (×3): qty 1

## 2018-05-23 MED ORDER — HYDROCHLOROTHIAZIDE 12.5 MG PO CAPS: 12.5000 mg | ORAL_CAPSULE | Freq: Every day | ORAL | Status: DC

## 2018-05-23 MED ORDER — OMEGA-3 1000 MG PO CAPS: 300.0000 mg | ORAL_CAPSULE | Freq: Every day | ORAL | Status: DC

## 2018-05-23 MED ORDER — LISINOPRIL-HYDROCHLOROTHIAZIDE 20-12.5 MG PO TABS: 1.0000 | ORAL_TABLET | Freq: Every day | ORAL | Status: DC

## 2018-05-23 MED ORDER — LISINOPRIL 20 MG PO TABS: 20.0000 mg | ORAL_TABLET | Freq: Every day | ORAL | Status: DC

## 2018-05-23 MED ORDER — ACETAMINOPHEN 325 MG PO TABS
650.0000 mg | ORAL_TABLET | ORAL | Status: DC | PRN
  Administered 2018-05-24: 650 mg via ORAL
  Filled 2018-05-23: qty 2

## 2018-05-23 MED ORDER — ADULT MULTIVITAMIN W/MINERALS CH
1.0000 | ORAL_TABLET | Freq: Every day | ORAL | Status: DC
  Administered 2018-05-24 – 2018-05-27 (×4): 1 via ORAL
  Filled 2018-05-23 (×5): qty 1

## 2018-05-23 MED ORDER — VERAPAMIL HCL ER 120 MG PO TBCR
120.0000 mg | EXTENDED_RELEASE_TABLET | Freq: Every day | ORAL | Status: DC
  Filled 2018-05-23 (×5): qty 1

## 2018-05-23 NOTE — ED Provider Notes (Signed)
[place] EMERGENCY DEPARTMENT Provider Note   CSN: [id number] Arrival date & time: 05/23/18  0911     History   Chief Complaint Chief Complaint  Patient presents with  . Chest Pain    HPI Jerry [name] is a 67 y.o. male.  67 year old male with prior medical history as detailed below presents with complaint of chest pain.  Patient reports that he had 45 minutes of chest discomfort just prior to arrival.  Symptoms resolved en route by EMS.  He is now pain-free.  He describes anterior chest soreness stretching from the right to the left shoulder.  This was associated with mild shortness of breath.  He has never had symptoms like this before.  He denies known history of CAD.  He does report having had a stress test "several years ago."  The history is provided by the patient and medical records.  Chest Pain   This is a new problem. The current episode started 1 to 2 hours ago. The problem occurs rarely. The problem has been resolved. The pain is present in the substernal region. The pain is mild. The quality of the pain is described as pressure-like. The pain does not radiate. Duration of episode(s) is 45 minutes. Associated symptoms include shortness of breath. He has tried nothing for the symptoms. The treatment provided no relief.    Past Medical History:  Diagnosis Date  . History of kidney stones   . Hypertension     There are no active problems to display for this patient.   Past Surgical History:  Procedure Laterality Date  . EXTRACORPOREAL SHOCK WAVE LITHOTRIPSY Right yrs ago, cysto 17  . INGUINAL HERNIA REPAIR Left 03/07/2016   Procedure: LEFT INGUINAL HERNIA REPAIR;  Surgeon: Donnie Mesa, MD;  Location: [place];  Service: General;  Laterality: Left;  . INSERTION OF MESH Left 03/07/2016   Procedure: INSERTION OF MESH;  Surgeon: Donnie Mesa, MD;  Location: [place];  Service: General;  Laterality: Left;        Home Medications    Prior to  Admission medications   Medication Sig Start Date End Date Taking? Authorizing Provider  atorvastatin (LIPITOR) 40 MG tablet Take 40 mg by mouth daily.   Yes [provider]  diclofenac (VOLTAREN) 75 MG EC tablet Take 75 mg by mouth daily as needed for mild pain.  03/04/15  Yes [provider]  ketoconazole (NIZORAL) 2 % cream Apply 1 application topically daily as needed (for rosacea).   Yes [provider]  lisinopril-hydrochlorothiazide (PRINZIDE,ZESTORETIC) 20-12.5 MG tablet Take 1 tablet by mouth daily.   Yes [provider]  Magnesium 250 MG TABS Take 250 mg by mouth 2 (two) times daily.   Yes [provider]  Multiple Vitamin (MULTIVITAMIN) tablet Take 1 tablet by mouth daily.   Yes [provider]  Omega-3 1000 MG CAPS Take 300 mg by mouth daily.   Yes [provider]  verapamil (CALAN-SR) 240 MG CR tablet Take 120-240 mg by mouth See admin instructions. Take one tablet by mouth every morning and one half tablet every night.   Yes [provider]  HYDROcodone-acetaminophen (NORCO/VICODIN) 5-325 MG tablet Take 1 tablet by mouth every 4 (four) hours as needed. Patient not taking: Reported on 05/23/2018 03/07/16   Donnie Mesa, MD    Family History History reviewed. No pertinent family history.  Social History Social History   Tobacco Use  . Smoking status: Former Smoker    Types:  Pipe    Last attempt to quit: 02/28/2000    Years since quitting: 18.2  Substance Use Topics  . Alcohol use: Yes    Comment: wine occ none in month  . Drug use: No     Allergies   Ciprofloxacin; Tamsulosin; and Chloraprep one step [chlorhexidine gluconate]   Review of Systems Review of Systems  Respiratory: Positive for shortness of breath.   Cardiovascular: Positive for chest pain.  All other systems reviewed and are negative.    Physical Exam Updated Vital Signs BP 127/76   Pulse 99   Temp 97.6 F (36.4 C)  (Temporal)   Resp 15   Ht 5\' 10"  (1.778 m)   Wt 83.5 kg   SpO2 97%   BMI 26.40 kg/m   Physical Exam  Constitutional: He is oriented to person, place, and time. He appears well-developed and well-nourished. No distress.  HENT:  Head: Normocephalic and atraumatic.  Mouth/Throat: Oropharynx is clear and moist.  Eyes: Pupils are equal, round, and reactive to light. Conjunctivae and EOM are normal.  Neck: Normal range of motion. Neck supple.  Cardiovascular: Normal rate, regular rhythm and normal heart sounds.  Pulmonary/Chest: Effort normal and breath sounds normal. No respiratory distress.  Abdominal: Soft. He exhibits no distension. There is no tenderness.  Musculoskeletal: Normal range of motion. He exhibits no edema or deformity.  Neurological: He is alert and oriented to person, place, and time.  Skin: Skin is warm and dry.  Psychiatric: He has a normal mood and affect.  Nursing note and vitals reviewed.    ED Treatments / Results  Labs (all labs ordered are listed, but only abnormal results are displayed) Labs Reviewed  BASIC METABOLIC PANEL - Abnormal; Notable for the following components:      Result Value   Glucose, Bld 180 (*)    All other components within normal limits  I-STAT TROPONIN, ED - Abnormal; Notable for the following components:   Troponin i, poc 0.12 (*)    All other components within normal limits  CBC    EKG EKG Interpretation  Date/Time:  Friday May 23 2018 09:15:23 EDT Ventricular Rate:  66 PR Interval:    QRS Duration: 99 QT Interval:  400 QTC Calculation: 420 R Axis:   -20 Text Interpretation:  Sinus rhythm Borderline left axis deviation Borderline T wave abnormalities Confirmed by Dene Gentry [phone]) on 05/23/2018 9:39:59 AM Also confirmed by Dene Gentry [phone]), editor Lynder Parents [phone])  on 05/23/2018 11:04:41 AM   Radiology Dg Chest 2 View  Result Date: 05/23/2018 CLINICAL DATA:  Chest pain. EXAM: CHEST - 2 VIEW  COMPARISON:  Radiographs of October 14, 2015. FINDINGS: The heart size and mediastinal contours are within normal limits. Both lungs are clear. No pneumothorax or pleural effusion is noted. The visualized skeletal structures are unremarkable. IMPRESSION: No active cardiopulmonary disease. Electronically Signed   By: Marijo Conception, M.D.   On: 05/23/2018 09:56    Procedures Procedures (including critical care time)  Medications Ordered in ED Medications  nitroGLYCERIN (NITROGLYN) 2 % ointment 0.5 inch (0.5 inches Topical Given 05/23/18 1042)     Initial Impression / Assessment and Plan / ED Course  I have reviewed the triage vital signs and the nursing notes.  Pertinent labs & imaging results that were available during my care of the patient were reviewed by me and considered in my medical decision making (see chart for details).    MDM  Screen complete  Patient is presenting  for evaluation of reported chest pain.  Initial EKG is without evidence of acute ischemia.  Patient is pain-free and comfortable upon my evaluation.  Patient will require further work-up and evaluation on an inpatient basis.  Hospitalist service is aware of the case and will evaluate for admission.  Cardiology is aware of case and will evaluate the patient.    Final Clinical Impressions(s) / ED Diagnoses   Final diagnoses:  Chest pain, unspecified type    ED Discharge Orders    None       Valarie Merino, MD 05/23/18 1129

## 2018-05-23 NOTE — Progress Notes (Signed)
   05/23/18 0920  Clinical Encounter Type  Visited With Health care provider  Visit Type Code  Advance Directives (For Healthcare)  Does Patient Have a Medical Advance Directive? Yes  Does patient want to make changes to medical advance directive? No - Patient declined  Type of Advance Directive [place] in Chart? No - copy requested  Would patient like information on creating a medical advance directive? No - Patient declined  [place]  Does Patient Have a Mental Health Advance Directive? No  Would patient like information on creating a mental health advance directive? No - Patient declined  Responded to [facility] in ED. Per nurse stemi cancelled will treat pt. In ED Provided presence.

## 2018-05-23 NOTE — Progress Notes (Signed)
Received page regarding elevated troponin of 2.58. Pt not currently having CP. Cardiology previously consulted. Will consult pharmacy for heparin dosing for ACS. Continue to monitor. Contact cardiology if pt develops chest pain.    Lovey Newcomer, NP [facility] hospitalist 7p-7a [phone]

## 2018-05-23 NOTE — Progress Notes (Signed)
Pt requested to ambulate pt states he is very active at home. Pt ambulated unit x2 with steady pace. Pt had no chest pain or SOB.

## 2018-05-23 NOTE — Progress Notes (Unsigned)
Order for outpatient stress test has been placed. Our office will contact patient with appointment time.   Darreld Mclean, PA-C 05/23/2018 3:23pm

## 2018-05-23 NOTE — Progress Notes (Signed)
ANTICOAGULATION CONSULT NOTE - Initial Consult  Pharmacy Consult for heparin Indication: chest pain/ACS and elevated troponin  Allergies  Allergen Reactions  . Ciprofloxacin Other (See Comments)    Joint and muscle stiffness  . Tamsulosin Other (See Comments)    Closed up nasal passages and couldn't breathe  . Chloraprep One Step [Chlorhexidine Gluconate] Rash    Patient Measurements: Height: 5\' 10"  (177.8 cm) Weight: 175 lb (79.4 kg) IBW/kg (Calculated) : 73  Vital Signs: Temp: 97.7 F (36.5 C) (10/04 1524) Temp Source: Oral (10/04 1524) BP: 107/59 (10/04 1524) Pulse Rate: 57 (10/04 1524)  Labs: Recent Labs    05/23/18 0921 05/23/18 1200 05/23/18 1916  HGB 16.5  --   --   HCT 49.8  --   --   PLT 195  --   --   CREATININE 1.06  --   --   TROPONINI  --  <0.03 2.58*    Estimated Creatinine Clearance: 70.8 mL/min (by C-G formula based on SCr of 1.06 mg/dL).   Medical History: Past Medical History:  Diagnosis Date  . High cholesterol   . History of kidney stones   . Hypertension   . Migraine    "none since ~ 2014 or before" (05/23/2018)    Medications:  Medications Prior to Admission  Medication Sig Dispense Refill Last Dose  . atorvastatin (LIPITOR) 40 MG tablet Take 40 mg by mouth daily.   05/22/2018 at Unknown time  . ketoconazole (NIZORAL) 2 % cream Apply 1 application topically daily as needed (for rosacea).   as needed  . lisinopril-hydrochlorothiazide (PRINZIDE,ZESTORETIC) 20-12.5 MG tablet Take 1 tablet by mouth daily.   05/23/2018 at Unknown time  . Magnesium 250 MG TABS Take 250 mg by mouth 2 (two) times daily.   05/23/2018 at Unknown time  . Multiple Vitamin (MULTIVITAMIN) tablet Take 1 tablet by mouth daily.   05/23/2018 at Unknown time  . Omega-3 1000 MG CAPS Take 300 mg by mouth daily.   05/23/2018 at Unknown time  . verapamil (CALAN-SR) 240 MG CR tablet Take 120-240 mg by mouth See admin instructions. Take one tablet by mouth every morning and one  half tablet every night.   05/23/2018 at Unknown time  . HYDROcodone-acetaminophen (NORCO/VICODIN) 5-325 MG tablet Take 1 tablet by mouth every 4 (four) hours as needed. (Patient not taking: Reported on 05/23/2018) 40 tablet 0 Not Taking at Unknown time    Assessment: 67 y/o male who presented to the ED with CP. Troponin now rising, 0.03 > 2.58 however no active CP noted. Pharmacy consulted to begin IV heparin. No bleeding noted, CBC is normal.  Goal of Therapy:  Heparin level 0.3-0.7 units/ml Monitor platelets by anticoagulation protocol: Yes   Plan:  - Heparin 4000 units IV bolus then infuse at 1000 units/hr - Heparin level with am labs - Daily heparin level and CBC - Monitor for s/sx of bleeding   Renold Genta, PharmD, BCPS Clinical Pharmacist Clinical phone for 05/23/2018 until 10p is [phone] Please check AMION for all Pharmacist numbers by unit 05/23/2018 9:23 PM

## 2018-05-23 NOTE — ED Notes (Signed)
Dr. Francia Greaves notified of elevated I-stat trop of 0.12

## 2018-05-23 NOTE — ED Notes (Signed)
Cardiology in room. Ordered new EKG. EKG captured and given to cardiologist.

## 2018-05-23 NOTE — Progress Notes (Signed)
Echocardiogram 2D Echocardiogram has been performed.  05/23/2018 5:00 PM Maudry Mayhew, MHA, RVT, RDCS, RDMS

## 2018-05-23 NOTE — ED Triage Notes (Signed)
Per [facility], pt from home with complaint of generalized chest pain with tingling in both arms that began while eating breakfast. Pt took 4 asa called ems. Pain subsided in route. Stemi was called and then cancelled after pt seen in ED by [facility] and [facility] RN. VSS, NAD now.

## 2018-05-23 NOTE — Consult Note (Addendum)
Cardiology Consult    Patient ID: Jerry [name] MRN: [id number], DOB/AGE: Jun 20, 1951   Admit date: 05/23/2018 Date of Consult: 05/23/2018  Primary Physician: Alroy Dust, L.Marlou Sa, MD Primary Cardiologist: New Consult Requesting Provider: Albertine Patricia, MD  Patient Profile    Jerry [name] is a 67 y.o. male with a history of hypertension, hyperlipidemia, and former tobacco user over 20 years ago, who is being seen today for the evaluation of chest pain at the request of Dr. Waldron Labs.  History of Present Illness    Patient has known history of CAD. He saw a Cardiologist several years ago for chest pain that was thought to musculoskeletal in nature. Per patient, an exercise stress test was performed at that time and was normal (no records available at this time). Patient is overall healthy and active and has been in his usual state of health until this morning when he developed chest discomfort.  Patient reports he had finished eating breakfast this morning when he noticed a tightness across his chest that radiated to both arm with associated tingling in his left fingers. He ranks the pain as a 4-5/10. He notes mild shortness of breath associated with the chest pain but thinks it may have been related to some anxiety. He denies any recent illness and pain was not related to position. Patient states this pain did not feel like indigestion or reflux. Patient measured his blood pressure and reports a systolic blood pressure in the 170s mmHg, which is very abnormal for him. Patient keeps a detailed log of his blood pressure and systolic blood pressure is normally in the 120s mmHg, max 130s mmHg. Patient took 1 baby aspirin and then called 911. Patient was advised to chew 4 more baby aspirins with some relief of the pain. About half way to the hospital via EMS, patient noticed that he was completed pain free. Patient estimates that the pain lasted about 45 minutes.   Upon arrival to the ED,  EKG showed normal sinus rhythm, rate 66 bpm, with no acute ST changes. I-stat troponin elevated  at 0.12. Chest x-ray showed no acute processes. Hgb 16.5, Na 137, K 4.0, Glucose 180, SCr 1.06. Patient received nitroglycerin patch and IV fluids while in the ED.  Patient is a former tobacco user. He states he smoked a pipe for around 20 years but quit over 20 years ago. He denies any alcohol or illicit drug use. Patient has a significant family history of heart disease on his fathers side of the family. His father had CAD and has two CABGs, the first one at the age of 66 years old. All of his paternal uncles also have CAD. His paternal grandfather had a stroke.   Past Medical History   Past Medical History:  Diagnosis Date  . History of kidney stones   . Hypertension     Past Surgical History:  Procedure Laterality Date  . EXTRACORPOREAL SHOCK WAVE LITHOTRIPSY Right yrs ago, cysto 16  . INGUINAL HERNIA REPAIR Left 03/07/2016   Procedure: LEFT INGUINAL HERNIA REPAIR;  Surgeon: Donnie Mesa, MD;  Location: [place];  Service: General;  Laterality: Left;  . INSERTION OF MESH Left 03/07/2016   Procedure: INSERTION OF MESH;  Surgeon: Donnie Mesa, MD;  Location: [place];  Service: General;  Laterality: Left;     Allergies  Allergies  Allergen Reactions  . Ciprofloxacin Other (See Comments)    Joint and muscle stiffness  . Tamsulosin Other (See Comments)    Closed  up nasal passages and couldn't breathe  . Chloraprep One Step [Chlorhexidine Gluconate] Rash    Inpatient Medications    . [START ON 05/24/2018] aspirin EC  325 mg Oral Daily  . atorvastatin  40 mg Oral Daily  . lisinopril-hydrochlorothiazide  1 tablet Oral Daily  . Magnesium  250 mg Oral BID  . multivitamin  1 tablet Oral Daily  . Omega-3  300 mg Oral Daily  . verapamil  120-240 mg Oral See admin instructions     Family History    History reviewed. No pertinent family history. has no family status information on file.     Social History    Social History   Socioeconomic History  . Marital status: Married    Spouse name: Not on file  . Number of children: Not on file  . Years of education: Not on file  . Highest education level: Not on file  Occupational History  . Not on file  Social Needs  . Financial resource strain: Not on file  . Food insecurity:    Worry: Not on file    Inability: Not on file  . Transportation needs:    Medical: Not on file    Non-medical: Not on file  Tobacco Use  . Smoking status: Former Smoker    Types: Pipe    Last attempt to quit: 02/28/2000    Years since quitting: 18.2  Substance and Sexual Activity  . Alcohol use: Yes    Comment: wine occ none in month  . Drug use: No  . Sexual activity: Not on file  Lifestyle  . Physical activity:    Days per week: Not on file    Minutes per session: Not on file  . Stress: Not on file  Relationships  . Social connections:    Talks on phone: Not on file    Gets together: Not on file    Attends religious service: Not on file    Active member of club or organization: Not on file    Attends meetings of clubs or organizations: Not on file    Relationship status: Not on file  . Intimate partner violence:    Fear of current or ex partner: Not on file    Emotionally abused: Not on file    Physically abused: Not on file    Forced sexual activity: Not on file  Other Topics Concern  . Not on file  Social History Narrative  . Not on file     Review of Systems    Review of Systems  Constitutional: Negative for chills, fever, malaise/fatigue and weight loss.  HENT: Negative for congestion.   Respiratory: Positive for shortness of breath. Negative for cough and hemoptysis.   Cardiovascular: Positive for chest pain and palpitations. Negative for orthopnea, claudication and PND.  Gastrointestinal: Negative for abdominal pain, blood in stool, nausea and vomiting.  Genitourinary: Negative for hematuria.  Musculoskeletal:  Positive for joint pain.  Neurological: Positive for tingling. Negative for dizziness, weakness and headaches.  Endo/Heme/Allergies: Does not bruise/bleed easily.  Psychiatric/Behavioral: Negative for substance abuse.    Physical Exam    Blood pressure (!) 107/59, pulse (!) 57, temperature 97.7 F (36.5 C), temperature source Oral, resp. rate 12, height 5\' 10"  (1.778 m), weight 79.4 kg, SpO2 94 %.  General: 67 y.o. Caucasian male resting comfortably in no acute distress. Pleasant and cooperative. HEENT: Normal.  Neck: Supple. No bruits or JVD appreciated. Lungs: No increased work of breathing. Normal respiratory rate.  Clear to auscultation bilaterally. No wheezes, rhonchi, or rales. Heart: RRR. Distinct S1 and S2. Soft systolic murmur heard best at left upper sternal border. No gallops or rubs.  Abdomen: Soft, non-distended, and non-tender to palpation. Bowel sounds present in all 4 quadrants.   Extremities: No lower extremity edema. Posterior tibial and radial pulses 2+ and equal bilaterally. Neuro: Alert and oriented x3. No focal deficits. Moves all extremities spontaneously. Psych: Normal affect.  Labs    Troponin [facility] of Care Test) Recent Labs    05/23/18 0948  TROPIPOC 0.12*   Recent Labs    05/23/18 1200  TROPONINI <0.03   Lab Results  Component Value Date   WBC 6.4 05/23/2018   HGB 16.5 05/23/2018   HCT 49.8 05/23/2018   MCV 89.1 05/23/2018   PLT 195 05/23/2018    Recent Labs  Lab 05/23/18 0921  NA 137  K 4.0  CL 103  CO2 26  BUN 17  CREATININE 1.06  CALCIUM 9.3  GLUCOSE 180*   No results found for: CHOL, HDL, LDLCALC, TRIG No results found for: [facility]   Radiology Studies    Dg Chest 2 View  Result Date: 05/23/2018 CLINICAL DATA:  Chest pain. EXAM: CHEST - 2 VIEW COMPARISON:  Radiographs of October 14, 2015. FINDINGS: The heart size and mediastinal contours are within normal limits. Both lungs are clear. No pneumothorax or pleural effusion is  noted. The visualized skeletal structures are unremarkable. IMPRESSION: No active cardiopulmonary disease. Electronically Signed   By: Marijo Conception, M.D.   On: 05/23/2018 09:56    EKG     EKG: EKG was personally reviewed and demonstrates: normal sinus rhythm, rate 66 bpm, with nonspecific ST changes. Telemetry: Telemetry was personally reviewed and demonstrates: normal sinus rhythm/sinus bradycardia with rate ranging from 50s to 80s bpm.  Cardiac Imaging    None.   Assessment & Plan    1. Unstable Angina - Patient presented to the ED via EMS with chest tightness that radiated to both arms and associated tingling of left finger. Chest pain occurred at rest after finishing breakfast and lasted for approximately 45 minutes. Chest pain completely resolved with Aspirin. Patient denies any angina at this time. - Initial EKG showed normal sinus rhythm with nonspecific ST changes. - I-stat troponin 0.12. First serial troponin negative (<0.03). Continue to trend troponins.  - Check Hgb A1c. - Lipid panel pending. Continue Lipitor 40mg  daily. - Will wait to start beta blocker given borderline bradycardia (heart rate has been in the 50s and low 60s here in the ED). - Continue Lisinopril-HCTZ 20-12.5mg  daily. - Continue Aspirin 81mg  daily.  - Echo pending. - Will repeat EKG in the morning. - Given presentation of symptoms, cardiovascular risk factors (hypertension and hyperlipidemia), and significant family history, I believe patient would benefit from an ischemic workup. Discussed with Dr. Margaretann Loveless. If troponin remains negative and patient has no more chest pain, OK to discharge patient tomorrow and will schedule outpatient stress test for Monday (Placed order for outpatient stress test at [facility] office). If patient has recurrent chest pain or next troponins are elevated, will start Heparin and consider heart catheterization.    2. Hypertension - BP 175/81 mmHg upon arrival to the ED.  -  Patient keeps a detailed log of his BP and states systolic BP is usually in the 120s mmHg, max 130s mmHg. - BP well controlled at this time. Most recent BP 116/76 mmHg. - Continue home medications:  - Lisinopril-HCTZ 20-12.5mg   daily.  - Verapamil 240mg , 1 tablet every morning and 1/2 tablet every evening.  3. Hyperlipidemia - Lipid panel pending. - Continue home medication of Lipitor 40mg  daily. - OK to continue Omega-3.    Signed, Darreld Mclean, PA-C 05/23/2018, 3:32 PM  For questions or updates, please contact   Please consult [email or web address] for contact info under Cardiology/STEMI. ---------------------------------------------------------------------------   History and all data above reviewed.  Patient examined.  I agree with the findings as above.  HASTON CASEBOLT was seen in the emergency department for chest pain after eating breakfast. He is an active, healthy individual with a history of HTN, HLD, and pipe smoking. He has a strong, early family history of coronary artery disease.  He took a baby aspirin and called EMS who recommended he take 4 more. With this, the pain subsided. He did not take nitro while having chest pain. The pain radiated across his chest and into both arms, with left arm tingling. His initial troponin was mildly positive by I-stat, and the subsequent troponin was negative. He has been chest pain free since this morning.  Constitutional: No acute distress Eyes: pupils equally round and reactive to light, sclera non-icteric, normal conjunctiva and lids ENMT: normal dentition, moist mucous membranes Cardiovascular: regular rhythm, normal rate, no murmurs. S1 and S2 normal. Radial pulses normal bilaterally. No jugular venous distention.  Respiratory: clear to auscultation bilaterally GI : normal bowel sounds, soft and nontender. No distention.   MSK: extremities warm, well perfused. No edema. Slight swelling of the left foot dorsum due to injury  previously LYMPH: No lymphadenopathy noted of the head and neck NEURO: grossly nonfocal exam, moves all extremities. PSYCH: alert and oriented x 3, normal mood and affect.   All available labs, radiology testing, previous records reviewed. Agree with documented assessment and plan of my colleague as stated above with the following additions or changes:  Active Problems:   Chest pain   Essential hypertension   Hyperlipidemia    Plan: I agree with the plan as noted above with the following change:  The patient's second troponin was elevated to 2.58, without significant ECG changes. We will start heparin, and plan for cath for NSTEMI. I evaluated the patient around 10 pm, and he was chest pain free and hemodynamically stable.  I have reviewed the risks, indications, and alternatives to cardiac catheterization, possible angioplasty, and stenting with the patient. Risks include but are not limited to bleeding, infection, vascular injury, stroke, myocardial infection, arrhythmia, kidney injury, radiation-related injury in the case of prolonged fluoroscopy use, emergency cardiac surgery, and death. The patient understands the risks of serious complication is 1-2 in 5638 with diagnostic cardiac cath and 1-2% or less with angioplasty/stenting.   DAPT has not been loaded  Elouise Munroe, MD [facility] 05/23/18

## 2018-05-23 NOTE — Progress Notes (Signed)
CRITICAL VALUE ALERT  Critical Value:  Troponin =2.58  Date & Time Notied:  05/23/18 @ 08:36 Provider Notified: 05/23/18 @ 08:37  Orders Received/Actions taken: IV heparin started per provider order.

## 2018-05-23 NOTE — H&P (Signed)
TRH H&P   Patient Demographics:    Jerry [name], is a 67 y.o. male  MRN: [id number]   DOB - Apr 20, 1951  Admit Date - 05/23/2018  Outpatient Primary MD for the patient is Alroy Dust, L.Marlou Sa, MD  Referring MD/NP/PA: DR Francia Greaves  Patient coming from: Home  Chief Complaint  Patient presents with  . Chest Pain      HPI:    Jerry [name]  is a 67 y.o. male, with past medical history of hypertension, hyperlipidemia, presents with chest pain, first episode this morning while eating breakfast, midsternal, radiating to bilateral upper extremity, accompanied by some dyspnea, diaphoresis, but no nausea, he denies fever, chills, cough, productive sputum or hemoptysis, reports his pressure quality, no provoking factor, nor relieving factors, he took 4 baby aspirin at home, chest pain resolved in route to the hospital, he reports a history of coronary artery disease in family, father had first open heart surgery at age 2, patient is used to smoke a pipe before 20 years, reports stress test more than 10 years ago by PCP as routine work-up, since then no cardiac work-up. -In ED he is chest pain-free, but received Nitropaste, EKG showing flattening of the T wave, no old one to compare, troponins point-of-care in ED was 0.12, normal lab troponin still pending, I was called to admit for further evaluation.     Review of systems:    In addition to the HPI above,  No Fever-chills, No Headache, No changes with Vision or hearing, No problems swallowing food or Liquids, Ports chest pain, with some dyspnea, nausea, no cough  No Abdominal pain, No Vommitting, Bowel movements are regular, No Blood in stool or Urine, No dysuria, No new skin rashes or bruises, No new joints pains-aches,  No new weakness, tingling, numbness in any extremity, No recent weight gain or loss, No polyuria, polydypsia or  polyphagia, No significant Mental Stressors.  A full 10 point Review of Systems was done, except as stated above, all other Review of Systems were negative.   With Past History of the following :    Past Medical History:  Diagnosis Date  . History of kidney stones   . Hypertension       Past Surgical History:  Procedure Laterality Date  . EXTRACORPOREAL SHOCK WAVE LITHOTRIPSY Right yrs ago, cysto 16  . INGUINAL HERNIA REPAIR Left 03/07/2016   Procedure: LEFT INGUINAL HERNIA REPAIR;  Surgeon: Donnie Mesa, MD;  Location: [place];  Service: General;  Laterality: Left;  . INSERTION OF MESH Left 03/07/2016   Procedure: INSERTION OF MESH;  Surgeon: Donnie Mesa, MD;  Location: [place];  Service: General;  Laterality: Left;      Social History:     Social History   Tobacco Use  . Smoking status: Former Smoker    Types: Pipe    Last attempt to quit: 02/28/2000    Years  since quitting: 18.2  Substance Use Topics  . Alcohol use: Yes    Comment: wine occ none in month     Lives -home  Mobility -dependent     Family History :   Significant for heart disease at young age in his father, first open heart surgery at age of 32   Home Medications:   Prior to Admission medications   Medication Sig Start Date End Date Taking? Authorizing Provider  atorvastatin (LIPITOR) 40 MG tablet Take 40 mg by mouth daily.   Yes [provider]  ketoconazole (NIZORAL) 2 % cream Apply 1 application topically daily as needed (for rosacea).   Yes [provider]  lisinopril-hydrochlorothiazide (PRINZIDE,ZESTORETIC) 20-12.5 MG tablet Take 1 tablet by mouth daily.   Yes [provider]  Magnesium 250 MG TABS Take 250 mg by mouth 2 (two) times daily.   Yes [provider]  Multiple Vitamin (MULTIVITAMIN) tablet Take 1 tablet by mouth daily.   Yes [provider]  Omega-3 1000 MG CAPS Take 300 mg by mouth daily.   Yes [provider]  verapamil  (CALAN-SR) 240 MG CR tablet Take 120-240 mg by mouth See admin instructions. Take one tablet by mouth every morning and one half tablet every night.   Yes [provider]  HYDROcodone-acetaminophen (NORCO/VICODIN) 5-325 MG tablet Take 1 tablet by mouth every 4 (four) hours as needed. Patient not taking: Reported on 05/23/2018 03/07/16   Donnie Mesa, MD     Allergies:     Allergies  Allergen Reactions  . Ciprofloxacin Other (See Comments)    Joint and muscle stiffness  . Tamsulosin Other (See Comments)    Closed up nasal passages and couldn't breathe  . Chloraprep One Step [Chlorhexidine Gluconate] Rash     Physical Exam:   Vitals  Blood pressure (!) 144/74, pulse 66, temperature 97.6 F (36.4 C), temperature source Temporal, resp. rate 17, height 5\' 10"  (1.778 m), weight 83.5 kg, SpO2 99 %.   1. General follow-up male laying in bed in no apparent distress  2. Normal affect and insight, Not Suicidal or Homicidal, Awake Alert, Oriented X 3.  3. No F.N deficits, ALL C.Nerves Intact, Strength 5/5 all 4 extremities, Sensation intact all 4 extremities, Plantars down going.  4. Ears and Eyes appear Normal, Conjunctivae clear, PERRLA. Moist Oral Mucosa.  5. Supple Neck, No JVD, No cervical lymphadenopathy appriciated, No Carotid Bruits.  6. Symmetrical Chest wall movement, Good air movement bilaterally, CTAB.  7. RRR, No Gallops, Rubs or Murmurs, No Parasternal Heave.  8. Positive Bowel Sounds, Abdomen Soft, No tenderness, No organomegaly appriciated,No rebound -guarding or rigidity.  9.  No Cyanosis, Normal Skin Turgor, No Skin Rash or Bruise.  10. Good muscle tone,  joints appear normal , no effusions, Normal ROM.  11. No Palpable Lymph Nodes in Neck or Axillae     Data Review:    CBC Recent Labs  Lab 05/23/18 0921  WBC 6.4  HGB 16.5  HCT 49.8  PLT 195  MCV 89.1  MCH 29.5  MCHC 33.1  RDW 12.8    ------------------------------------------------------------------------------------------------------------------  Chemistries  Recent Labs  Lab 05/23/18 0921  NA 137  K 4.0  CL 103  CO2 26  GLUCOSE 180*  BUN 17  CREATININE 1.06  CALCIUM 9.3   ------------------------------------------------------------------------------------------------------------------ estimated creatinine clearance is 70.8 mL/min (by C-G formula based on SCr of 1.06 mg/dL). ------------------------------------------------------------------------------------------------------------------ No results for input(s): TSH, T4TOTAL, T3FREE, THYROIDAB in the last 72 hours.  Invalid input(s): FREET3  Coagulation profile No results for input(s): INR, PROTIME in the last 168 hours. ------------------------------------------------------------------------------------------------------------------- No results for input(s): DDIMER in the last 72 hours. -------------------------------------------------------------------------------------------------------------------  Cardiac Enzymes Recent Labs  Lab 05/23/18 1200  TROPONINI <0.03   ------------------------------------------------------------------------------------------------------------------ No results found for: BNP   ---------------------------------------------------------------------------------------------------------------  Urinalysis No results found for: COLORURINE, APPEARANCEUR, LABSPEC, PHURINE, GLUCOSEU, HGBUR, BILIRUBINUR, KETONESUR, PROTEINUR, UROBILINOGEN, NITRITE, LEUKOCYTESUR  ----------------------------------------------------------------------------------------------------------------   Imaging Results:    Dg Chest 2 View  Result Date: 05/23/2018 CLINICAL DATA:  Chest pain. EXAM: CHEST - 2 VIEW COMPARISON:  Radiographs of October 14, 2015. FINDINGS: The heart size and mediastinal contours are within normal limits. Both lungs are  clear. No pneumothorax or pleural effusion is noted. The visualized skeletal structures are unremarkable. IMPRESSION: No active cardiopulmonary disease. Electronically Signed   By: Marijo Conception, M.D.   On: 05/23/2018 09:56    My personal review of EKG: Rhythm NSR, Rate  66/min, QTc 420 , flattening of T waves in the lateral leads    Assessment & Plan:    Active Problems:   Chest pain   Essential hypertension   Hyperlipidemia   Chest pain -Patient presents with chest pain at rest, has some typical features, mild EKG changes, and borderline troponins in ED, so he will be admitted for further work-up given his history of coronary artery disease at young age in the family, hypertension and hyperlipidemia, will cycle troponins, monitor in telemetry, he already received aspirin, will check lipid panel, and will consult cardiology for further recommendation about work-up.  Hypertension -Blood pressure controlled, continue with home meds  Hyperlipidemia -Continue with home medication, check lipid panel   DVT Prophylaxis  SCDs  AM Labs Ordered, also please review Full Orders  Family Communication: Admission, patients condition and plan of care including tests being ordered have been discussed with the patient and wife who indicate understanding and agree with the plan and Code Status.  Code Status full code  Likely DC to home  Condition GUARDED    Consults called: Cardiology  Admission status: Observation  Time spent in minutes : 45 minutes   Phillips Climes M.D on 05/23/2018 at 1:32 PM  Between 7am to 7pm - Pager - [phone]. After 7pm go to [email or web address] - password [facility]  [facility] - Office  [phone]

## 2018-05-24 DIAGNOSIS — Z888 Allergy status to other drugs, medicaments and biological substances status: Secondary | ICD-10-CM | POA: Diagnosis not present

## 2018-05-24 DIAGNOSIS — R079 Chest pain, unspecified: Secondary | ICD-10-CM | POA: Diagnosis present

## 2018-05-24 DIAGNOSIS — I2510 Atherosclerotic heart disease of native coronary artery without angina pectoris: Secondary | ICD-10-CM | POA: Diagnosis not present

## 2018-05-24 DIAGNOSIS — E785 Hyperlipidemia, unspecified: Secondary | ICD-10-CM | POA: Diagnosis not present

## 2018-05-24 DIAGNOSIS — I10 Essential (primary) hypertension: Secondary | ICD-10-CM

## 2018-05-24 DIAGNOSIS — E7800 Pure hypercholesterolemia, unspecified: Secondary | ICD-10-CM | POA: Diagnosis not present

## 2018-05-24 DIAGNOSIS — R001 Bradycardia, unspecified: Secondary | ICD-10-CM | POA: Diagnosis not present

## 2018-05-24 DIAGNOSIS — Z87891 Personal history of nicotine dependence: Secondary | ICD-10-CM | POA: Diagnosis not present

## 2018-05-24 DIAGNOSIS — R7303 Prediabetes: Secondary | ICD-10-CM | POA: Diagnosis not present

## 2018-05-24 DIAGNOSIS — Z881 Allergy status to other antibiotic agents status: Secondary | ICD-10-CM | POA: Diagnosis not present

## 2018-05-24 DIAGNOSIS — Z79899 Other long term (current) drug therapy: Secondary | ICD-10-CM | POA: Diagnosis not present

## 2018-05-24 DIAGNOSIS — I214 Non-ST elevation (NSTEMI) myocardial infarction: Principal | ICD-10-CM

## 2018-05-24 DIAGNOSIS — G43909 Migraine, unspecified, not intractable, without status migrainosus: Secondary | ICD-10-CM | POA: Diagnosis not present

## 2018-05-24 DIAGNOSIS — Z8249 Family history of ischemic heart disease and other diseases of the circulatory system: Secondary | ICD-10-CM | POA: Diagnosis not present

## 2018-05-24 LAB — HEPARIN LEVEL (UNFRACTIONATED)
Heparin Unfractionated: 0.74 IU/mL — ABNORMAL HIGH (ref 0.30–0.70)
Heparin Unfractionated: 0.42 IU/mL (ref 0.30–0.70)

## 2018-05-24 LAB — CBC
HCT: 48.2 % (ref 39.0–52.0)
Hemoglobin: 15.8 g/dL (ref 13.0–17.0)
MCH: 29.2 pg (ref 26.0–34.0)
MCHC: 32.8 g/dL (ref 30.0–36.0)
MCV: 89.1 fL (ref 78.0–100.0)
Platelets: 218 10*3/uL (ref 150–400)
RBC: 5.41 MIL/uL (ref 4.22–5.81)
RDW: 13.2 % (ref 11.5–15.5)
WBC: 8 10*3/uL (ref 4.0–10.5)

## 2018-05-24 LAB — LIPID PANEL
CHOLESTEROL: 137 mg/dL (ref 0–200)
HDL: 40 mg/dL — ABNORMAL LOW (ref >40)
LDL Cholesterol: 85 mg/dL (ref 0–99)
TRIGLYCERIDES: 61 mg/dL (ref <150)
Total CHOL/HDL Ratio: 3.4 RATIO
VLDL: 12 mg/dL (ref 0–40)

## 2018-05-24 LAB — TROPONIN I
Troponin I: 4.38 ng/mL (ref <0.03)
Troponin I: 2.32 ng/mL (ref <0.03)

## 2018-05-24 LAB — HIV ANTIBODY (ROUTINE TESTING W REFLEX): HIV Screen 4th Generation wRfx: NONREACTIVE

## 2018-05-24 MED ORDER — METOPROLOL TARTRATE 12.5 MG HALF TABLET
12.5000 mg | ORAL_TABLET | Freq: Two times a day (BID) | ORAL | Status: DC
  Administered 2018-05-24 – 2018-05-27 (×6): 12.5 mg via ORAL
  Filled 2018-05-24 (×7): qty 1

## 2018-05-24 MED ORDER — LISINOPRIL 10 MG PO TABS
20.0000 mg | ORAL_TABLET | Freq: Every day | ORAL | Status: DC
  Administered 2018-05-24 – 2018-05-27 (×4): 20 mg via ORAL
  Filled 2018-05-24 (×3): qty 1
  Filled 2018-05-24: qty 2

## 2018-05-24 MED ORDER — HYDROCHLOROTHIAZIDE 25 MG PO TABS
25.0000 mg | ORAL_TABLET | Freq: Every day | ORAL | Status: DC
  Administered 2018-05-24 – 2018-05-27 (×4): 25 mg via ORAL
  Filled 2018-05-24 (×4): qty 1

## 2018-05-24 NOTE — Progress Notes (Signed)
Progress Note  Patient Name: Jerry [name] Date of Encounter: 05/24/2018  Primary Cardiologist: Elouise Munroe, MD   Subjective   Currently chest pain-free, laying comfortably in bed with his family at bedside.  Yesterday morning after breakfast had sternal radiation across his chest wall chest tightness took an aspirin, EMS.  Chest pain-free through hospitalization.  Inpatient Medications    Scheduled Meds: . aspirin EC  325 mg Oral Daily  . atorvastatin  40 mg Oral q1800  . hydrochlorothiazide  25 mg Oral Daily   And  . lisinopril  20 mg Oral Daily  . magnesium gluconate  250 mg Oral BID  . multivitamin with minerals  1 tablet Oral Daily  . verapamil  240 mg Oral q morning - 10a   And  . verapamil  120 mg Oral QHS   Continuous Infusions: . heparin 1,000 Units/hr (05/23/18 2151)   PRN Meds: acetaminophen, ondansetron (ZOFRAN) IV   Vital Signs    Vitals:   05/23/18 1345 05/23/18 1524 05/23/18 2157 05/24/18 0559  BP: 116/76 (!) 107/59 135/78 137/83  Pulse: (!) 57 (!) 57  65  Resp: 12   14  Temp:  97.7 F (36.5 C) 98 F (36.7 C) 98.3 F (36.8 C)  TempSrc:  Oral Oral Oral  SpO2: 94%  95% 95%  Weight:  79.4 kg  79.2 kg  Height:        Intake/Output Summary (Last 24 hours) at 05/24/2018 1035 Last data filed at 05/24/2018 0920 Gross per 24 hour  Intake 0 ml  Output -  Net 0 ml   Filed Weights   05/23/18 0918 05/23/18 1524 05/24/18 0559  Weight: 83.5 kg 79.4 kg 79.2 kg    Telemetry    Sinus rhythm- Personally Reviewed  ECG    Sinus rhythm with nonspecific ST-T wave changes.- Personally Reviewed  Physical Exam   GEN: No acute distress.   Neck: No JVD Cardiac: RRR, no murmurs, rubs, or gallops.  Respiratory: Clear to auscultation bilaterally. GI: Soft, nontender, non-distended  MS: No edema; No deformity. Neuro:  Nonfocal  Psych: Normal affect   Labs    Chemistry Recent Labs  Lab 05/23/18 0921  NA 137  K 4.0  CL 103  CO2 26    GLUCOSE 180*  BUN 17  CREATININE 1.06  CALCIUM 9.3  GFRNONAA >60  GFRAA >60  ANIONGAP 8     Hematology Recent Labs  Lab 05/23/18 0921 05/24/18 0716  WBC 6.4 8.0  RBC 5.59 5.41  HGB 16.5 15.8  HCT 49.8 48.2  MCV 89.1 89.1  MCH 29.5 29.2  MCHC 33.1 32.8  RDW 12.8 13.2  PLT 195 218    Cardiac Enzymes Recent Labs  Lab 05/23/18 1200 05/23/18 1916 05/24/18 0008 05/24/18 0716  TROPONINI <0.03 2.58* 4.38* 2.32*    Recent Labs  Lab 05/23/18 0948  TROPIPOC 0.12*     BNPNo results for input(s): BNP, PROBNP in the last 168 hours.   DDimer No results for input(s): DDIMER in the last 168 hours.   Radiology    Dg Chest 2 View  Result Date: 05/23/2018 CLINICAL DATA:  Chest pain. EXAM: CHEST - 2 VIEW COMPARISON:  Radiographs of October 14, 2015. FINDINGS: The heart size and mediastinal contours are within normal limits. Both lungs are clear. No pneumothorax or pleural effusion is noted. The visualized skeletal structures are unremarkable. IMPRESSION: No active cardiopulmonary disease. Electronically Signed   By: Marijo Conception, M.D.   On:  05/23/2018 09:56    Cardiac Studies   Echo 05/23/2018:  - Left ventricle: The cavity size was normal. Wall thickness was   normal. Systolic function was normal. The estimated ejection   fraction was in the range of 60% to 65%. Wall motion was normal;   there were no regional wall motion abnormalities. Doppler   parameters are consistent with abnormal left ventricular   relaxation (grade 1 diastolic dysfunction). The Ee&' ratio is   between 8-15, suggesting indeterminate LV filling pressure. - Mitral valve: Mildly thickened leaflets . There was trivial   regurgitation. - Left atrium: The atrium was normal in size. - Inferior vena cava: The vessel was normal in size. The   respirophasic diameter changes were in the normal range (>= 50%),   consistent with normal central venous pressure.  Impressions:  - LVEF 60-65%, normal  wall thickness, normal wall motion, grade 1   DD, indeterminate LV filling pressure, trivial MR, normal LA   size, normal IVC.  Patient Profile     67 y.o. male with non-ST elevation myocardial infarction, hypertension, hyperlipidemia, former tobacco use  Assessment & Plan    Non-ST elevation myocardial infarction -Troponin 4.3, no significant EKG changes, on IV heparin, statin, I will add low-dose beta-blocker.  He has been taking his verapamil for several years to help with migraine prophylaxis.  This is worked Copy for him.  We will continue.  I did express that we would like to have beta-blocker on board however.  Cardiac catheterization has been discussed.  I also discussed with on-call interventionalist.  Since he is currently chest pain-free without any signs of injury current on ECG and his troponins are trending downward and currently on IV heparin, we will plan on cardiac catheterization when Cath Lab is fully staffed on Monday.  Discussed with family.  Make n.p.o. past midnight on Sunday.  He may eat today.    Hypertension/hyperlipidemia -Medications reviewed.  No changes made.     For questions or updates, please contact [place] Please consult [email or web address] for contact info under        Signed, Candee Furbish, MD  05/24/2018, 10:35 AM

## 2018-05-24 NOTE — Progress Notes (Signed)
MD on call notified about incorrect dosing of Verapamil and Hydrochlorothiazide. Awaiting callback  1st call 0745 2nd call 0810  Lasandra Beech

## 2018-05-24 NOTE — Progress Notes (Signed)
PROGRESS NOTE  Jerry [name] [id number] DOB: March 29, 1951 DOA: 05/23/2018 PCP: Alroy Dust, L.Marlou Sa, MD  HPI/Recap of past 24 hours: Jerry [name]  is a 67 y.o. male, with past medical history of hypertension, hyperlipidemia, presents with chest pain, first episode this morning while eating breakfast, midsternal, radiating to bilateral upper extremity, accompanied by some dyspnea, diaphoresis, but no nausea, he denies fever, chills, cough, productive sputum or hemoptysis, reports his pressure quality, no provoking factor, nor relieving factors, he took 4 baby aspirin at home, chest pain resolved in route to the hospital, he reports a history of coronary artery disease in family, father had first open heart surgery at age 65, patient is used to smoke a pipe before 20 years, reports stress test more than 10 years ago by PCP as routine work-up, since then no cardiac work-up. -In ED he is chest pain-free, but received Nitropaste, EKG showing flattening of the T wave, no old one to compare, troponins point-of-care in ED was 0.12, normal lab troponin still pending, I was called to admit for further evaluation.  05/24/18: Seen and examined at bedside. Denies chest pain, palpitations or dyspnea. Troponin trending up. Cardiology following. Plan for [facility] on Monday. NPO after midnight on Sunday.  Assessment/Plan: Active Problems:   Chest pain   Essential hypertension   Hyperlipidemia  NSTEMI/ACS No specific ST T changes on 12 leads EKG Trop peaked at 4.38, trending down C/w hep drip Cardiac meds as rec by cardiology C/w monitoring on telemetry Plan for [facility] on Monday NPO after midnight on Sunday  HTN BP well controlled C/w antihypertensive and cardiac meds C/w vital signs monitoring  HLD C/w statin ldl 77   Family hx of CAD Father, uncles He is only child  Risks: High risk for decompensation due to NSTEMI, multiple comorbidities and advanced age. Plan for [facility] on Monday. Will need  continuous close monitoring prior to left heart cath. Will move to inpatient status as he will require at least 2 midnights.      Code Status: full  Family Communication: none at bedside  Disposition Plan: Home possibly Monday or Tuesday or when cardiology signs off.   Consultants:  cardiology  Procedures:  None   Antimicrobials:  None   DVT prophylaxis:  hep drip   Objective: Vitals:   05/23/18 1345 05/23/18 1524 05/23/18 2157 05/24/18 0559  BP: 116/76 (!) 107/59 135/78 137/83  Pulse: (!) 57 (!) 57  65  Resp: 12   14  Temp:  97.7 F (36.5 C) 98 F (36.7 C) 98.3 F (36.8 C)  TempSrc:  Oral Oral Oral  SpO2: 94%  95% 95%  Weight:  79.4 kg  79.2 kg  Height:        Intake/Output Summary (Last 24 hours) at 05/24/2018 1426 Last data filed at 05/24/2018 1416 Gross per 24 hour  Intake 220 ml  Output -  Net 220 ml   Filed Weights   05/23/18 0918 05/23/18 1524 05/24/18 0559  Weight: 83.5 kg 79.4 kg 79.2 kg    Exam:  . General: 67 y.o. year-old male well developed well nourished in no acute distress.  Alert and oriented x3. . Cardiovascular: Regular rate and rhythm with no rubs or gallops.  No thyromegaly or JVD noted.   Marland Kitchen Respiratory: Clear to auscultation with no wheezes or rales. Good inspiratory effort. . Abdomen: Soft nontender nondistended with normal bowel sounds x4 quadrants. . Musculoskeletal: No lower extremity edema. 2/4 pulses in all 4 extremities. . Skin: No ulcerative  lesions noted or rashes, . Psychiatry: Mood is appropriate for condition and setting   Data Reviewed: CBC: Recent Labs  Lab 05/23/18 0921 05/24/18 0716  WBC 6.4 8.0  HGB 16.5 15.8  HCT 49.8 48.2  MCV 89.1 89.1  PLT 195 098   Basic Metabolic Panel: Recent Labs  Lab 05/23/18 0921  NA 137  K 4.0  CL 103  CO2 26  GLUCOSE 180*  BUN 17  CREATININE 1.06  CALCIUM 9.3   GFR: Estimated Creatinine Clearance: 70.8 mL/min (by C-G formula based on SCr of 1.06  mg/dL). Liver Function Tests: No results for input(s): AST, ALT, ALKPHOS, BILITOT, PROT, ALBUMIN in the last 168 hours. No results for input(s): LIPASE, AMYLASE in the last 168 hours. No results for input(s): AMMONIA in the last 168 hours. Coagulation Profile: No results for input(s): INR, PROTIME in the last 168 hours. Cardiac Enzymes: Recent Labs  Lab 05/23/18 1200 05/23/18 1916 05/24/18 0008 05/24/18 0716  TROPONINI <0.03 2.58* 4.38* 2.32*   BNP (last 3 results) No results for input(s): PROBNP in the last 8760 hours. HbA1C: No results for input(s): HGBA1C in the last 72 hours. CBG: No results for input(s): GLUCAP in the last 168 hours. Lipid Profile: Recent Labs    05/24/18 0716  CHOL 137  HDL 40*  LDLCALC 85  TRIG 61  CHOLHDL 3.4   Thyroid Function Tests: No results for input(s): TSH, T4TOTAL, FREET4, T3FREE, THYROIDAB in the last 72 hours. Anemia Panel: No results for input(s): VITAMINB12, FOLATE, FERRITIN, TIBC, IRON, RETICCTPCT in the last 72 hours. Urine analysis: No results found for: COLORURINE, APPEARANCEUR, LABSPEC, PHURINE, GLUCOSEU, HGBUR, BILIRUBINUR, KETONESUR, PROTEINUR, UROBILINOGEN, NITRITE, LEUKOCYTESUR Sepsis Labs: @LABRCNTIP (procalcitonin:4,lacticidven:4)  )No results found for this or any previous visit (from the past 240 hour(s)).    Studies: No results found.  Scheduled Meds: . aspirin EC  325 mg Oral Daily  . atorvastatin  40 mg Oral q1800  . hydrochlorothiazide  25 mg Oral Daily   And  . lisinopril  20 mg Oral Daily  . magnesium gluconate  250 mg Oral BID  . metoprolol tartrate  12.5 mg Oral BID  . multivitamin with minerals  1 tablet Oral Daily  . verapamil  240 mg Oral q morning - 10a   And  . verapamil  120 mg Oral QHS    Continuous Infusions: . heparin 1,000 Units/hr (05/23/18 2151)     LOS: 0 days     Kayleen Memos, MD [facility] Pager [phone]  If 7PM-7AM, please contact  night-coverage [email or web address] Password [email or web address] 05/24/2018, 2:26 PM

## 2018-05-24 NOTE — Progress Notes (Signed)
ANTICOAGULATION CONSULT NOTE - Follow-Up Consult  Pharmacy Consult for heparin Indication: chest pain/ACS and elevated troponin  Allergies  Allergen Reactions  . Ciprofloxacin Other (See Comments)    Joint and muscle stiffness  . Tamsulosin Other (See Comments)    Closed up nasal passages and couldn't breathe  . Chloraprep One Step [Chlorhexidine Gluconate] Rash    Patient Measurements: Height: 5\' 10"  (177.8 cm) Weight: 174 lb 9.6 oz (79.2 kg) IBW/kg (Calculated) : 73  Vital Signs: Temp: 98.3 F (36.8 C) (10/05 0559) Temp Source: Oral (10/05 0559) BP: 137/83 (10/05 0559) Pulse Rate: 65 (10/05 0559)  Labs: Recent Labs    05/23/18 0921  05/23/18 1916 05/24/18 0008 05/24/18 0716  HGB 16.5  --   --   --  15.8  HCT 49.8  --   --   --  48.2  PLT 195  --   --   --  218  HEPARINUNFRC  --   --   --  0.74* 0.42  CREATININE 1.06  --   --   --   --   TROPONINI  --    < > 2.58* 4.38* 2.32*   < > = values in this interval not displayed.    Estimated Creatinine Clearance: 70.8 mL/min (by C-G formula based on SCr of 1.06 mg/dL).   Medical History: Past Medical History:  Diagnosis Date  . High cholesterol   . History of kidney stones   . Hypertension   . Migraine    "none since ~ 2014 or before" (05/23/2018)    Medications:  Medications Prior to Admission  Medication Sig Dispense Refill Last Dose  . atorvastatin (LIPITOR) 40 MG tablet Take 40 mg by mouth daily.   05/22/2018 at Unknown time  . ketoconazole (NIZORAL) 2 % cream Apply 1 application topically daily as needed (for rosacea).   as needed  . lisinopril-hydrochlorothiazide (PRINZIDE,ZESTORETIC) 20-25 MG tablet Take 1 tablet by mouth daily.    05/23/2018 at Unknown time  . Magnesium 250 MG TABS Take 250 mg by mouth 2 (two) times daily.   05/23/2018 at Unknown time  . Multiple Vitamin (MULTIVITAMIN) tablet Take 1 tablet by mouth daily.   05/23/2018 at Unknown time  . Omega-3 1000 MG CAPS Take 300 mg by mouth daily.    05/23/2018 at Unknown time  . verapamil (CALAN-SR) 240 MG CR tablet Take 120-240 mg by mouth See admin instructions. Take one tablet by mouth every morning and one half tablet every night.   05/23/2018 at Unknown time  . HYDROcodone-acetaminophen (NORCO/VICODIN) 5-325 MG tablet Take 1 tablet by mouth every 4 (four) hours as needed. (Patient not taking: Reported on 05/23/2018) 40 tablet 0 Not Taking at Unknown time    Assessment: 67 y/o male who presented to the ED with CP and elevated troponins. Pharmacy consulted for heparin dosing. Initial heparin level therapeutic at 0.42, CBC stable.  Goal of Therapy:  Heparin level 0.3-0.7 units/ml Monitor platelets by anticoagulation protocol: Yes   Plan:  -Continue IV heparin 1000 units/h -Monitor daily heparin level and CBC -Follow-up plans for cath  Arrie Senate, PharmD, BCPS Clinical Pharmacist [phone] Please check AMION for all [place] numbers 05/24/2018

## 2018-05-25 LAB — CBC
HCT: 52.1 % — ABNORMAL HIGH (ref 39.0–52.0)
Hemoglobin: 16.8 g/dL (ref 13.0–17.0)
MCH: 29.3 pg (ref 26.0–34.0)
MCHC: 32.2 g/dL (ref 30.0–36.0)
MCV: 90.8 fL (ref 78.0–100.0)
PLATELETS: 220 10*3/uL (ref 150–400)
RBC: 5.74 MIL/uL (ref 4.22–5.81)
RDW: 13.1 % (ref 11.5–15.5)
WBC: 8.1 10*3/uL (ref 4.0–10.5)

## 2018-05-25 LAB — HEMOGLOBIN A1C
HEMOGLOBIN A1C: 6.1 % — AB (ref 4.8–5.6)
Mean Plasma Glucose: 128 mg/dL

## 2018-05-25 LAB — HEPARIN LEVEL (UNFRACTIONATED): HEPARIN UNFRACTIONATED: 0.36 [IU]/mL (ref 0.30–0.70)

## 2018-05-25 MED ORDER — ASPIRIN EC 81 MG PO TBEC
81.0000 mg | DELAYED_RELEASE_TABLET | Freq: Every day | ORAL | Status: DC
  Filled 2018-05-25: qty 1

## 2018-05-25 MED ORDER — ASPIRIN 81 MG PO CHEW
81.0000 mg | CHEWABLE_TABLET | ORAL | Status: AC
  Administered 2018-05-26: 81 mg via ORAL
  Filled 2018-05-25: qty 1

## 2018-05-25 MED ORDER — SODIUM CHLORIDE 0.9 % IV SOLN: 250.0000 mL | INTRAVENOUS | Status: DC | PRN

## 2018-05-25 MED ORDER — SODIUM CHLORIDE 0.9% FLUSH: 3.0000 mL | INTRAVENOUS | Status: DC | PRN

## 2018-05-25 MED ORDER — SODIUM CHLORIDE 0.9 % WEIGHT BASED INFUSION
1.0000 mL/kg/h | INTRAVENOUS | Status: DC
  Administered 2018-05-25: 1 mL/kg/h via INTRAVENOUS

## 2018-05-25 MED ORDER — SODIUM CHLORIDE 0.9% FLUSH
3.0000 mL | Freq: Two times a day (BID) | INTRAVENOUS | Status: DC
  Administered 2018-05-26: 3 mL via INTRAVENOUS

## 2018-05-25 NOTE — Progress Notes (Signed)
ANTICOAGULATION CONSULT NOTE - Follow-Up Consult  Pharmacy Consult for heparin Indication: chest pain/ACS and elevated troponin  Allergies  Allergen Reactions  . Ciprofloxacin Other (See Comments)    Joint and muscle stiffness  . Tamsulosin Other (See Comments)    Closed up nasal passages and couldn't breathe  . Chloraprep One Step [Chlorhexidine Gluconate] Rash    Patient Measurements: Height: 5\' 10"  (177.8 cm) Weight: 173 lb 1.6 oz (78.5 kg) IBW/kg (Calculated) : 73  Vital Signs: Temp: 98.1 F (36.7 C) (10/06 0530) Temp Source: Oral (10/06 0530) BP: 123/73 (10/06 0530) Pulse Rate: 48 (10/06 0530)  Labs: Recent Labs    05/23/18 0921  05/23/18 1916 05/24/18 0008 05/24/18 0716 05/25/18 0642  HGB 16.5  --   --   --  15.8 16.8  HCT 49.8  --   --   --  48.2 52.1*  PLT 195  --   --   --  218 220  HEPARINUNFRC  --   --   --  0.74* 0.42 0.36  CREATININE 1.06  --   --   --   --   --   TROPONINI  --    < > 2.58* 4.38* 2.32*  --    < > = values in this interval not displayed.    Estimated Creatinine Clearance: 70.8 mL/min (by C-G formula based on SCr of 1.06 mg/dL).   Medical History: Past Medical History:  Diagnosis Date  . High cholesterol   . History of kidney stones   . Hypertension   . Migraine    "none since ~ 2014 or before" (05/23/2018)    Medications:  Medications Prior to Admission  Medication Sig Dispense Refill Last Dose  . atorvastatin (LIPITOR) 40 MG tablet Take 40 mg by mouth daily.   05/22/2018 at Unknown time  . ketoconazole (NIZORAL) 2 % cream Apply 1 application topically daily as needed (for rosacea).   as needed  . lisinopril-hydrochlorothiazide (PRINZIDE,ZESTORETIC) 20-25 MG tablet Take 1 tablet by mouth daily.    05/23/2018 at Unknown time  . Magnesium 250 MG TABS Take 250 mg by mouth 2 (two) times daily.   05/23/2018 at Unknown time  . Multiple Vitamin (MULTIVITAMIN) tablet Take 1 tablet by mouth daily.   05/23/2018 at Unknown time  . Omega-3  1000 MG CAPS Take 300 mg by mouth daily.   05/23/2018 at Unknown time  . verapamil (CALAN-SR) 240 MG CR tablet Take 120-240 mg by mouth See admin instructions. Take one tablet by mouth every morning and one half tablet every night.   05/23/2018 at Unknown time  . HYDROcodone-acetaminophen (NORCO/VICODIN) 5-325 MG tablet Take 1 tablet by mouth every 4 (four) hours as needed. (Patient not taking: Reported on 05/23/2018) 40 tablet 0 Not Taking at Unknown time    Assessment: 67 y/o male who presented to the ED with CP and elevated troponins. Pharmacy consulted for heparin dosing. Heparin level therapeutic at 0.36, CBC stable, planning for cath tomorrow.  Goal of Therapy:  Heparin level 0.3-0.7 units/ml Monitor platelets by anticoagulation protocol: Yes   Plan:  -Continue IV heparin 1000 units/h -Monitor daily heparin level and CBC -Follow-up plans for cath  Jerry [name], PharmD, BCPS Clinical Pharmacist [phone] Please check AMION for all [facility] numbers 05/25/2018

## 2018-05-25 NOTE — Progress Notes (Signed)
PROGRESS NOTE  Jerry [name] DOB: 04-Jun-1951 DOA: 05/23/2018 PCP: Jerry [name]  HPI/Recap of past 24 hours: HPI from Dr Jerry Crane [name],with past medical history of hypertension, hyperlipidemia, presents with chest pain, midsternal, radiating to bilateral upper extremity, accompanied by some dyspnea, diaphoresis. Pt took 4 baby aspirin at home, chest pain resolved in route to the hospital, he reports a history of coronary artery disease in family, father had first open heart surgery at age 63. Pt used to smoke a pipe, quit about 20 years ago. In ED, EKG showed flattening T wave, no old one to compare, troponins point-of-care in ED was 0.12. Cardiology consulted. Pt admitted for further management.  Today, pt denies any new complaints, denies any chest pain, SOB, diaphoresis, N/V. Plan for [facility] on 05/26/18.  Assessment/Plan: Active Problems:   Chest pain   Essential hypertension   Hyperlipidemia  NSTEMI Currently chest pain-free Troponin peaked to 4.38, currently trending down EKG with no acute ST changes Echo showed EF of 60 to 65%, no regional wall motion abnormalities, grade 1 diastolic dysfunction Cardiology on board, plan for [facility] on 05/26/2018 Continue with heparin drip, aspirin, statins, metoprolol Close monitoring on telemetry  Hypertension Stable Continue HCTZ, lisinopril, verapamil  Hyperlipidemia LDL 85  Continue statins    Code Status: Full  Family Communication: None at bedside  Disposition Plan: Once work-up complete   Consultants:  Cardiology  Procedures:  None  Antimicrobials:  None  DVT prophylaxis: Heparin drip   Objective: Vitals:   05/24/18 0559 05/24/18 1946 05/24/18 2232 05/25/18 0530  BP: 137/83 126/69 135/74 123/73  Pulse: 65 (!) 54 60 (!) 48  Resp: 14 17  18   Temp: 98.3 F (36.8 C) 98.5 F (36.9 C)  98.1 F (36.7 C)  TempSrc: Oral Oral  Oral  SpO2: 95% 96%  93%  Weight: 79.2  kg   78.5 kg  Height:        Intake/Output Summary (Last 24 hours) at 05/25/2018 1038 Last data filed at 05/25/2018 0500 Gross per 24 hour  Intake 1580 ml  Output -  Net 1580 ml   Filed Weights   05/23/18 1524 05/24/18 0559 05/25/18 0530  Weight: 79.4 kg 79.2 kg 78.5 kg    Exam:   General: NAD  Cardiovascular: S1, S2 present  Respiratory: CTAB  Abdomen: Soft, nontender, nondistended, bowel sounds present  Musculoskeletal: No pedal edema bilaterally  Skin: Normal  Psychiatry: Normal mood   Data Reviewed: CBC: Recent Labs  Lab 05/23/18 0921 05/24/18 0716 05/25/18 0642  WBC 6.4 8.0 8.1  HGB 16.5 15.8 16.8  HCT 49.8 48.2 52.1*  MCV 89.1 89.1 90.8  PLT 195 218 382   Basic Metabolic Panel: Recent Labs  Lab 05/23/18 0921  NA 137  K 4.0  CL 103  CO2 26  GLUCOSE 180*  BUN 17  CREATININE 1.06  CALCIUM 9.3   GFR: Estimated Creatinine Clearance: 70.8 mL/min (by C-G formula based on SCr of 1.06 mg/dL). Liver Function Tests: No results for input(s): AST, ALT, ALKPHOS, BILITOT, PROT, ALBUMIN in the last 168 hours. No results for input(s): LIPASE, AMYLASE in the last 168 hours. No results for input(s): AMMONIA in the last 168 hours. Coagulation Profile: No results for input(s): INR, PROTIME in the last 168 hours. Cardiac Enzymes: Recent Labs  Lab 05/23/18 1200 05/23/18 1916 05/24/18 0008 05/24/18 0716  TROPONINI <0.03 2.58* 4.38* 2.32*   BNP (last 3 results) No results for input(s): PROBNP in the last  8760 hours. HbA1C: No results for input(s): HGBA1C in the last 72 hours. CBG: No results for input(s): GLUCAP in the last 168 hours. Lipid Profile: Recent Labs    05/24/18 0716  CHOL 137  HDL 40*  LDLCALC 85  TRIG 61  CHOLHDL 3.4   Thyroid Function Tests: No results for input(s): TSH, T4TOTAL, FREET4, T3FREE, THYROIDAB in the last 72 hours. Anemia Panel: No results for input(s): VITAMINB12, FOLATE, FERRITIN, TIBC, IRON, RETICCTPCT in the last  72 hours. Urine analysis: No results found for: COLORURINE, APPEARANCEUR, LABSPEC, PHURINE, GLUCOSEU, HGBUR, BILIRUBINUR, KETONESUR, PROTEINUR, UROBILINOGEN, NITRITE, LEUKOCYTESUR Sepsis Labs: @LABRCNTIP (procalcitonin:4,lacticidven:4)  )No results found for this or any previous visit (from the past 240 hour(s)).    Studies: No results found.  Scheduled Meds: . aspirin EC  325 mg Oral Daily  . atorvastatin  40 mg Oral q1800  . hydrochlorothiazide  25 mg Oral Daily   And  . lisinopril  20 mg Oral Daily  . magnesium gluconate  250 mg Oral BID  . metoprolol tartrate  12.5 mg Oral BID  . multivitamin with minerals  1 tablet Oral Daily  . verapamil  240 mg Oral q morning - 10a   And  . verapamil  120 mg Oral QHS    Continuous Infusions: . heparin 1,000 Units/hr (05/24/18 1732)     LOS: 1 day     Jerry Friendly, [name] [facility]   If 7PM-7AM, please contact night-coverage [email or web address] 05/25/2018, 10:38 AM

## 2018-05-25 NOTE — Progress Notes (Signed)
Progress Note  Patient Name: Jerry [name] Date of Encounter: 05/25/2018  Primary Cardiologist: Elouise Munroe, MD   Subjective   No chest pain fevers chills nausea vomiting.  Awaiting heart cath  Inpatient Medications    Scheduled Meds: . aspirin EC  325 mg Oral Daily  . atorvastatin  40 mg Oral q1800  . hydrochlorothiazide  25 mg Oral Daily   And  . lisinopril  20 mg Oral Daily  . magnesium gluconate  250 mg Oral BID  . metoprolol tartrate  12.5 mg Oral BID  . multivitamin with minerals  1 tablet Oral Daily  . verapamil  240 mg Oral q morning - 10a   And  . verapamil  120 mg Oral QHS   Continuous Infusions: . heparin 1,000 Units/hr (05/24/18 1732)   PRN Meds: acetaminophen, ondansetron (ZOFRAN) IV   Vital Signs    Vitals:   05/24/18 0559 05/24/18 1946 05/24/18 2232 05/25/18 0530  BP: 137/83 126/69 135/74 123/73  Pulse: 65 (!) 54 60 (!) 48  Resp: 14 17  18   Temp: 98.3 F (36.8 C) 98.5 F (36.9 C)  98.1 F (36.7 C)  TempSrc: Oral Oral  Oral  SpO2: 95% 96%  93%  Weight: 79.2 kg   78.5 kg  Height:        Intake/Output Summary (Last 24 hours) at 05/25/2018 1129 Last data filed at 05/25/2018 0500 Gross per 24 hour  Intake 1580 ml  Output -  Net 1580 ml   Filed Weights   05/23/18 1524 05/24/18 0559 05/25/18 0530  Weight: 79.4 kg 79.2 kg 78.5 kg    Telemetry    Sinus bradycardia, no pauses, no adverse arrhythmias- Personally Reviewed  ECG    Sinus bradycardia nonspecific T wave changes- Personally Reviewed  Physical Exam   GEN: No acute distress.   Neck: No JVD Cardiac: RRR, no murmurs, rubs, or gallops.  Respiratory: Clear to auscultation bilaterally. GI: Soft, nontender, non-distended  MS: No edema; No deformity. Neuro:  Nonfocal  Psych: Normal affect   Labs    Chemistry Recent Labs  Lab 05/23/18 0921  NA 137  K 4.0  CL 103  CO2 26  GLUCOSE 180*  BUN 17  CREATININE 1.06  CALCIUM 9.3  GFRNONAA >60  GFRAA >60  ANIONGAP 8      Hematology Recent Labs  Lab 05/23/18 0921 05/24/18 0716 05/25/18 0642  WBC 6.4 8.0 8.1  RBC 5.59 5.41 5.74  HGB 16.5 15.8 16.8  HCT 49.8 48.2 52.1*  MCV 89.1 89.1 90.8  MCH 29.5 29.2 29.3  MCHC 33.1 32.8 32.2  RDW 12.8 13.2 13.1  PLT 195 218 220    Cardiac Enzymes Recent Labs  Lab 05/23/18 1200 05/23/18 1916 05/24/18 0008 05/24/18 0716  TROPONINI <0.03 2.58* 4.38* 2.32*    Recent Labs  Lab 05/23/18 0948  TROPIPOC 0.12*     BNPNo results for input(s): BNP, PROBNP in the last 168 hours.   DDimer No results for input(s): DDIMER in the last 168 hours.   Radiology    No results found.  Cardiac Studies   - LVEF 60-65%, normal wall thickness, normal wall motion, grade 1 DD, indeterminate LV filling pressure, trivial MR, normal LA size, normal IVC.  Patient Profile     67 y.o. male with non-ST elevation myocardial infarction, hyperlipidemia, hypertension, former tobacco user  Assessment & Plan    Non-ST elevation myocardial infarction -Cath tomorrow.  Troponin peak 4.3.  Started low-dose beta-blocker in  conjunction with his chronic verapamil use which he is utilizing for migraine prophylaxis quite successfully.  Does not want to change.  Hypertension/hyperlipidemia - Continue statin, well-controlled.  Bradycardia -Asymptomatic, iatrogenic in part from both verapamil and metoprolol.  Note, he does not want to change his verapamil.  Migraines were helped out significantly with this.  Hemoglobin 16.8 - Yesterday 15.8. -I will check ferritin to make sure that there is no evidence of hemochromatosis.  For questions or updates, please contact [place] Please consult [email or web address] for contact info under        Signed, Candee Furbish, MD  05/25/2018, 11:29 AM

## 2018-05-26 ENCOUNTER — Encounter (HOSPITAL_COMMUNITY)
Admission: EM | Disposition: A | Discharge status: Home / Self Care | Payer: Self-pay | Source: Home / Self Care | Attending: Internal Medicine

## 2018-05-26 DIAGNOSIS — E785 Hyperlipidemia, unspecified: Secondary | ICD-10-CM

## 2018-05-26 DIAGNOSIS — R001 Bradycardia, unspecified: Secondary | ICD-10-CM

## 2018-05-26 DIAGNOSIS — I2510 Atherosclerotic heart disease of native coronary artery without angina pectoris: Secondary | ICD-10-CM

## 2018-05-26 DIAGNOSIS — I214 Non-ST elevation (NSTEMI) myocardial infarction: Secondary | ICD-10-CM

## 2018-05-26 HISTORY — PX: CORONARY STENT INTERVENTION: CATH118234

## 2018-05-26 HISTORY — PX: LEFT HEART CATH AND CORONARY ANGIOGRAPHY: CATH118249

## 2018-05-26 LAB — BASIC METABOLIC PANEL
Anion gap: 10 (ref 5–15)
BUN: 20 mg/dL (ref 8–23)
CALCIUM: 8.9 mg/dL (ref 8.9–10.3)
CHLORIDE: 105 mmol/L (ref 98–111)
CO2: 24 mmol/L (ref 22–32)
CREATININE: 1.09 mg/dL (ref 0.61–1.24)
GFR calc non Af Amer: >60 mL/min (ref >60)
Glucose, Bld: 112 mg/dL — ABNORMAL HIGH (ref 70–99)
Potassium: 4 mmol/L (ref 3.5–5.1)
SODIUM: 139 mmol/L (ref 135–145)

## 2018-05-26 LAB — CBC
HCT: 46.2 % (ref 39.0–52.0)
HEMOGLOBIN: 15.1 g/dL (ref 13.0–17.0)
MCH: 29.2 pg (ref 26.0–34.0)
MCHC: 32.7 g/dL (ref 30.0–36.0)
MCV: 89.4 fL (ref 78.0–100.0)
Platelets: 187 10*3/uL (ref 150–400)
RBC: 5.17 MIL/uL (ref 4.22–5.81)
RDW: 13 % (ref 11.5–15.5)
WBC: 8 10*3/uL (ref 4.0–10.5)

## 2018-05-26 LAB — FERRITIN: Ferritin: 93 ng/mL (ref 24–336)

## 2018-05-26 LAB — POCT ACTIVATED CLOTTING TIME: ACTIVATED CLOTTING TIME: 329 s

## 2018-05-26 LAB — HEMOGLOBIN A1C
HEMOGLOBIN A1C: 6 % — AB (ref 4.8–5.6)
Mean Plasma Glucose: 125.5 mg/dL

## 2018-05-26 LAB — HEPARIN LEVEL (UNFRACTIONATED): HEPARIN UNFRACTIONATED: 0.4 [IU]/mL (ref 0.30–0.70)

## 2018-05-26 SURGERY — LEFT HEART CATH AND CORONARY ANGIOGRAPHY
Anesthesia: LOCAL

## 2018-05-26 MED ORDER — SODIUM CHLORIDE 0.9 % IV SOLN: 250.0000 mL | INTRAVENOUS | Status: DC | PRN

## 2018-05-26 MED ORDER — HEPARIN (PORCINE) IN NACL 1000-0.9 UT/500ML-% IV SOLN
INTRAVENOUS | Status: AC
  Filled 2018-05-26: qty 1000

## 2018-05-26 MED ORDER — HYDRALAZINE HCL 20 MG/ML IJ SOLN: 5.0000 mg | INTRAMUSCULAR | Status: AC | PRN

## 2018-05-26 MED ORDER — SODIUM CHLORIDE 0.9% FLUSH: 3.0000 mL | INTRAVENOUS | Status: DC | PRN

## 2018-05-26 MED ORDER — HEPARIN (PORCINE) IN NACL 1000-0.9 UT/500ML-% IV SOLN
INTRAVENOUS | Status: DC | PRN
  Administered 2018-05-26 (×2): 500 mL

## 2018-05-26 MED ORDER — SODIUM CHLORIDE 0.9 % WEIGHT BASED INFUSION
1.0000 mL/kg/h | INTRAVENOUS | Status: AC
  Administered 2018-05-26 (×2): 1 mL/kg/h via INTRAVENOUS

## 2018-05-26 MED ORDER — TICAGRELOR 90 MG PO TABS
90.0000 mg | ORAL_TABLET | Freq: Two times a day (BID) | ORAL | Status: DC
  Administered 2018-05-27: 06:00:00 90 mg via ORAL
  Filled 2018-05-26: qty 1

## 2018-05-26 MED ORDER — TICAGRELOR 90 MG PO TABS
ORAL_TABLET | ORAL | Status: AC
  Filled 2018-05-26: qty 1

## 2018-05-26 MED ORDER — ASPIRIN 81 MG PO CHEW
81.0000 mg | CHEWABLE_TABLET | Freq: Every day | ORAL | Status: DC
  Administered 2018-05-27: 11:00:00 81 mg via ORAL
  Filled 2018-05-26: qty 1

## 2018-05-26 MED ORDER — LABETALOL HCL 5 MG/ML IV SOLN: 10.0000 mg | INTRAVENOUS | Status: AC | PRN

## 2018-05-26 MED ORDER — IOHEXOL 350 MG/ML SOLN
INTRAVENOUS | Status: DC | PRN
  Administered 2018-05-26: 115 mL via INTRA_ARTERIAL

## 2018-05-26 MED ORDER — SODIUM CHLORIDE 0.9% FLUSH
3.0000 mL | Freq: Two times a day (BID) | INTRAVENOUS | Status: DC
  Administered 2018-05-27: 3 mL via INTRAVENOUS

## 2018-05-26 MED ORDER — MIDAZOLAM HCL 2 MG/2ML IJ SOLN
INTRAMUSCULAR | Status: AC
  Filled 2018-05-26: qty 2

## 2018-05-26 MED ORDER — TICAGRELOR 90 MG PO TABS
ORAL_TABLET | ORAL | Status: DC | PRN
  Administered 2018-05-26: 180 mg via ORAL

## 2018-05-26 MED ORDER — NITROGLYCERIN 1 MG/10 ML FOR IR/CATH LAB
INTRA_ARTERIAL | Status: AC
  Filled 2018-05-26: qty 10

## 2018-05-26 MED ORDER — VERAPAMIL HCL 2.5 MG/ML IV SOLN
INTRAVENOUS | Status: AC
  Filled 2018-05-26: qty 2

## 2018-05-26 MED ORDER — MIDAZOLAM HCL 2 MG/2ML IJ SOLN
INTRAMUSCULAR | Status: DC | PRN
  Administered 2018-05-26: 1 mg via INTRAVENOUS

## 2018-05-26 MED ORDER — LIDOCAINE HCL (PF) 1 % IJ SOLN
INTRAMUSCULAR | Status: AC
  Filled 2018-05-26: qty 30

## 2018-05-26 MED ORDER — HEART ATTACK BOUNCING BOOK
Freq: Once | Status: AC
  Administered 2018-05-26: 22:00:00
  Filled 2018-05-26: qty 1

## 2018-05-26 MED ORDER — VERAPAMIL HCL 2.5 MG/ML IV SOLN
INTRAVENOUS | Status: DC | PRN
  Administered 2018-05-26: 10 mL via INTRA_ARTERIAL

## 2018-05-26 MED ORDER — LIDOCAINE HCL (PF) 1 % IJ SOLN
INTRAMUSCULAR | Status: DC | PRN
  Administered 2018-05-26: 2 mL via SUBCUTANEOUS

## 2018-05-26 MED ORDER — FENTANYL CITRATE (PF) 100 MCG/2ML IJ SOLN
INTRAMUSCULAR | Status: AC
  Filled 2018-05-26: qty 2

## 2018-05-26 MED ORDER — HEPARIN SODIUM (PORCINE) 1000 UNIT/ML IJ SOLN
INTRAMUSCULAR | Status: DC | PRN
  Administered 2018-05-26 (×2): 4000 [IU] via INTRAVENOUS

## 2018-05-26 MED ORDER — FENTANYL CITRATE (PF) 100 MCG/2ML IJ SOLN
INTRAMUSCULAR | Status: DC | PRN
  Administered 2018-05-26: 25 ug via INTRAVENOUS

## 2018-05-26 MED ORDER — NITROGLYCERIN 1 MG/10 ML FOR IR/CATH LAB
INTRA_ARTERIAL | Status: DC | PRN
  Administered 2018-05-26 (×2): 200 ug via INTRACORONARY

## 2018-05-26 MED ORDER — ANGIOPLASTY BOOK
Freq: Once | Status: AC
  Administered 2018-05-26: 22:00:00
  Filled 2018-05-26: qty 1

## 2018-05-26 SURGICAL SUPPLY — 17 items
BALLN SAPPHIRE 2.0X12 (BALLOONS) ×2
BALLN SAPPHIRE ~~LOC~~ 3.0X12 (BALLOONS) ×1 IMPLANT
BALLOON SAPPHIRE 2.0X12 (BALLOONS) IMPLANT
CATH 5FR JL3.5 JR4 ANG PIG MP (CATHETERS) ×1 IMPLANT
CATH VISTA GUIDE 6FR JR4 (CATHETERS) ×1 IMPLANT
DEVICE RAD COMP TR BAND LRG (VASCULAR PRODUCTS) ×1 IMPLANT
GLIDESHEATH SLEND SS 6F .021 (SHEATH) ×1 IMPLANT
GUIDEWIRE INQWIRE 1.5J.035X260 (WIRE) IMPLANT
INQWIRE 1.5J .035X260CM (WIRE) ×2
KIT ENCORE 26 ADVANTAGE (KITS) ×1 IMPLANT
KIT HEART LEFT (KITS) ×2 IMPLANT
PACK CARDIAC CATHETERIZATION (CUSTOM PROCEDURE TRAY) ×2 IMPLANT
STENT SYNERGY DES 2.5X20 (Permanent Stent) ×1 IMPLANT
SYR MEDRAD MARK V 150ML (SYRINGE) ×2 IMPLANT
TRANSDUCER W/STOPCOCK (MISCELLANEOUS) ×2 IMPLANT
TUBING CIL FLEX 10 FLL-RA (TUBING) ×2 IMPLANT
WIRE ASAHI PROWATER 180CM (WIRE) ×1 IMPLANT

## 2018-05-26 NOTE — Progress Notes (Signed)
ANTICOAGULATION CONSULT NOTE - Follow-Up Consult  Pharmacy Consult for heparin Indication: chest pain/ACS and elevated troponin  Allergies  Allergen Reactions  . Ciprofloxacin Other (See Comments)    Joint and muscle stiffness  . Tamsulosin Other (See Comments)    Closed up nasal passages and couldn't breathe  . Chloraprep One Step [Chlorhexidine Gluconate] Rash    Patient Measurements: Height: 5\' 10"  (177.8 cm) Weight: 173 lb 12.8 oz (78.8 kg) IBW/kg (Calculated) : 73  Vital Signs: Temp: 98.1 F (36.7 C) (10/07 0616) Temp Source: Oral (10/07 0616) BP: 121/70 (10/07 0616) Pulse Rate: 49 (10/07 0616)  Labs: Recent Labs    05/23/18 0921  05/23/18 1916  05/24/18 0008 05/24/18 0716 05/25/18 0642 05/26/18 0501  HGB 16.5  --   --   --   --  15.8 16.8 15.1  HCT 49.8  --   --   --   --  48.2 52.1* 46.2  PLT 195  --   --   --   --  218 220 187  HEPARINUNFRC  --   --   --    < > 0.74* 0.42 0.36 0.40  CREATININE 1.06  --   --   --   --   --   --  1.09  TROPONINI  --    < > 2.58*  --  4.38* 2.32*  --   --    < > = values in this interval not displayed.    Estimated Creatinine Clearance: 68.8 mL/min (by C-G formula based on SCr of 1.09 mg/dL).   Medical History: Past Medical History:  Diagnosis Date  . High cholesterol   . History of kidney stones   . Hypertension   . Migraine    "none since ~ 2014 or before" (05/23/2018)    Medications:  Medications Prior to Admission  Medication Sig Dispense Refill Last Dose  . atorvastatin (LIPITOR) 40 MG tablet Take 40 mg by mouth daily.   05/22/2018 at Unknown time  . ketoconazole (NIZORAL) 2 % cream Apply 1 application topically daily as needed (for rosacea).   as needed  . lisinopril-hydrochlorothiazide (PRINZIDE,ZESTORETIC) 20-25 MG tablet Take 1 tablet by mouth daily.    05/23/2018 at Unknown time  . Magnesium 250 MG TABS Take 250 mg by mouth 2 (two) times daily.   05/23/2018 at Unknown time  . Multiple Vitamin (MULTIVITAMIN)  tablet Take 1 tablet by mouth daily.   05/23/2018 at Unknown time  . Omega-3 1000 MG CAPS Take 300 mg by mouth daily.   05/23/2018 at Unknown time  . verapamil (CALAN-SR) 240 MG CR tablet Take 120-240 mg by mouth See admin instructions. Take one tablet by mouth every morning and one half tablet every night.   05/23/2018 at Unknown time  . HYDROcodone-acetaminophen (NORCO/VICODIN) 5-325 MG tablet Take 1 tablet by mouth every 4 (four) hours as needed. (Patient not taking: Reported on 05/23/2018) 40 tablet 0 Not Taking at Unknown time    Assessment: 67 y/o male who presented to the ED with CP and elevated troponins. Pharmacy consulted for heparin dosing. Plans for cath today -heparin level at goal, CBC stable  Goal of Therapy:  Heparin level 0.3-0.7 units/ml Monitor platelets by anticoagulation protocol: Yes   Plan:  -Continue IV heparin 1000 units/h -Monitor daily heparin level and CBC -Follow-up plans for cath  Hildred Laser, PharmD Clinical Pharmacist Please check Amion for pharmacy contact number

## 2018-05-26 NOTE — Interval H&P Note (Signed)
History and Physical Interval Note:  05/26/2018 6:29 PM  Jerry [name]  has presented today for surgery, with the diagnosis of NSTEMI  The various methods of treatment have been discussed with the patient and family. After consideration of risks, benefits and other options for treatment, the patient has consented to  Procedure(s): LEFT HEART CATH AND CORONARY ANGIOGRAPHY (N/A) as a surgical intervention .  The patient's history has been reviewed, patient examined, no change in status, stable for surgery.  I have reviewed the patient's chart and labs.  Questions were answered to the patient's satisfaction.    Cath Lab Visit (complete for each Cath Lab visit)  Clinical Evaluation Leading to the Procedure:   ACS: Yes.    Non-ACS:    Anginal Classification: CCS IV  Anti-ischemic medical therapy: Maximal Therapy (2 or more classes of medications)  Non-Invasive Test Results: No non-invasive testing performed  Prior CABG: No previous CABG       Collier Salina [facility] 05/26/2018 6:30 PM

## 2018-05-26 NOTE — Progress Notes (Signed)
PROGRESS NOTE  Jerry [name]:[id number] DOB: 07-21-1951 DOA: 05/23/2018 PCP: Alroy Dust, L.Marlou Sa, MD  HPI/Recap of past 24 hours: HPI from Dr Nevada Crane [name],with past medical history of hypertension, hyperlipidemia, presents with chest pain, midsternal, radiating to bilateral upper extremity, accompanied by some dyspnea, diaphoresis. Pt took 4 baby aspirin at home, chest pain resolved in route to the hospital, he reports a history of coronary artery disease in family, father had first open heart surgery at age 32. Pt used to smoke a pipe, quit about 20 years ago. In ED, EKG showed flattening T wave, no old one to compare, troponins point-of-care in ED was 0.12. Cardiology consulted. Pt admitted for further management.  Today, pt denies any new complaints. For LHC today  Assessment/Plan: Active Problems:   Chest pain   Essential hypertension   Hyperlipidemia  NSTEMI Currently chest pain-free Troponin peaked to 4.38, currently trending down EKG with no acute ST changes Echo showed EF of 60 to 65%, no regional wall motion abnormalities, grade 1 diastolic dysfunction Cardiology on board, plan for LHC today Continue with heparin drip, aspirin, statins, metoprolol Close monitoring on telemetry  Hypertension Stable Continue HCTZ, lisinopril, verapamil  Hyperlipidemia LDL 85  Continue statins  Pre-diabetes A1c is 6.0 Lifestyle modifications advised    Code Status: Full  Family Communication: Wife at bedside  Disposition Plan: Once work-up complete, pending LHC   Consultants:  Cardiology  Procedures:  None  Antimicrobials:  None  DVT prophylaxis: Heparin drip   Objective: Vitals:   05/25/18 2043 05/26/18 0616 05/26/18 0901 05/26/18 1119  BP: 116/71 121/70 128/78 137/74  Pulse: (!) 56 (!) 49 (!) 51 (!) 49  Resp: 18 16  20   Temp: 98 F (36.7 C) 98.1 F (36.7 C)  98.1 F (36.7 C)  TempSrc: Oral Oral  Oral  SpO2: 96% 99%  99%    Weight:  78.8 kg    Height:        Intake/Output Summary (Last 24 hours) at 05/26/2018 1410 Last data filed at 05/26/2018 0400 Gross per 24 hour  Intake 1057.6 ml  Output 450 ml  Net 607.6 ml   Filed Weights   05/24/18 0559 05/25/18 0530 05/26/18 0616  Weight: 79.2 kg 78.5 kg 78.8 kg    Exam:   General: NAD  Cardiovascular: S1, S2 present  Respiratory: CTAB  Abdomen: Soft, nontender, nondistended, bowel sounds present  Musculoskeletal: No pedal edema bilaterally  Skin: Normal  Psychiatry: Normal mood   Data Reviewed: CBC: Recent Labs  Lab 05/23/18 0921 05/24/18 0716 05/25/18 0642 05/26/18 0501  WBC 6.4 8.0 8.1 8.0  HGB 16.5 15.8 16.8 15.1  HCT 49.8 48.2 52.1* 46.2  MCV 89.1 89.1 90.8 89.4  PLT 195 218 220 595   Basic Metabolic Panel: Recent Labs  Lab 05/23/18 0921 05/26/18 0501  NA 137 139  K 4.0 4.0  CL 103 105  CO2 26 24  GLUCOSE 180* 112*  BUN 17 20  CREATININE 1.06 1.09  CALCIUM 9.3 8.9   GFR: Estimated Creatinine Clearance: 68.8 mL/min (by C-G formula based on SCr of 1.09 mg/dL). Liver Function Tests: No results for input(s): AST, ALT, ALKPHOS, BILITOT, PROT, ALBUMIN in the last 168 hours. No results for input(s): LIPASE, AMYLASE in the last 168 hours. No results for input(s): AMMONIA in the last 168 hours. Coagulation Profile: No results for input(s): INR, PROTIME in the last 168 hours. Cardiac Enzymes: Recent Labs  Lab 05/23/18 1200 05/23/18 1916 05/24/18 0008 05/24/18 6387  TROPONINI <0.03 2.58* 4.38* 2.32*   BNP (last 3 results) No results for input(s): PROBNP in the last 8760 hours. HbA1C: Recent Labs    05/23/18 1916 05/26/18 0501  HGBA1C 6.1* 6.0*   CBG: No results for input(s): GLUCAP in the last 168 hours. Lipid Profile: Recent Labs    05/24/18 0716  CHOL 137  HDL 40*  LDLCALC 85  TRIG 61  CHOLHDL 3.4   Thyroid Function Tests: No results for input(s): TSH, T4TOTAL, FREET4, T3FREE, THYROIDAB in the last  72 hours. Anemia Panel: Recent Labs    05/26/18 0501  FERRITIN 93   Urine analysis: No results found for: COLORURINE, APPEARANCEUR, LABSPEC, PHURINE, GLUCOSEU, HGBUR, BILIRUBINUR, KETONESUR, PROTEINUR, UROBILINOGEN, NITRITE, LEUKOCYTESUR Sepsis Labs: @LABRCNTIP (procalcitonin:4,lacticidven:4)  )No results found for this or any previous visit (from the past 240 hour(s)).    Studies: No results found.  Scheduled Meds: . aspirin EC  81 mg Oral Daily  . atorvastatin  40 mg Oral q1800  . hydrochlorothiazide  25 mg Oral Daily   And  . lisinopril  20 mg Oral Daily  . magnesium gluconate  250 mg Oral BID  . metoprolol tartrate  12.5 mg Oral BID  . multivitamin with minerals  1 tablet Oral Daily  . sodium chloride flush  3 mL Intravenous Q12H    Continuous Infusions: . sodium chloride    . sodium chloride 1 mL/kg/hr (05/25/18 2202)  . heparin 1,000 Units/hr (05/26/18 0400)     LOS: 2 days     Alma Friendly, MD [facility]   If 7PM-7AM, please contact night-coverage [email or web address] 05/26/2018, 2:10 PM

## 2018-05-26 NOTE — Progress Notes (Signed)
Progress Note  Patient Name: SHAHID FLORI Date of Encounter: 05/26/2018  Primary Cardiologist: Elouise Munroe, MD   Subjective   Feeling well. No chest pain, sob or palpitations.   Inpatient Medications    Scheduled Meds: . aspirin EC  81 mg Oral Daily  . atorvastatin  40 mg Oral q1800  . hydrochlorothiazide  25 mg Oral Daily   And  . lisinopril  20 mg Oral Daily  . magnesium gluconate  250 mg Oral BID  . metoprolol tartrate  12.5 mg Oral BID  . multivitamin with minerals  1 tablet Oral Daily  . sodium chloride flush  3 mL Intravenous Q12H  . verapamil  240 mg Oral q morning - 10a   And  . verapamil  120 mg Oral QHS   Continuous Infusions: . sodium chloride    . sodium chloride 1 mL/kg/hr (05/25/18 2202)  . heparin 1,000 Units/hr (05/26/18 0400)   PRN Meds: sodium chloride, acetaminophen, ondansetron (ZOFRAN) IV, sodium chloride flush   Vital Signs    Vitals:   05/24/18 2232 05/25/18 0530 05/25/18 2043 05/26/18 0616  BP: 135/74 123/73 116/71 121/70  Pulse: 60 (!) 48 (!) 56 (!) 49  Resp:  18 18 16   Temp:  98.1 F (36.7 C) 98 F (36.7 C) 98.1 F (36.7 C)  TempSrc:  Oral Oral Oral  SpO2:  93% 96% 99%  Weight:  78.5 kg  78.8 kg  Height:        Intake/Output Summary (Last 24 hours) at 05/26/2018 0826 Last data filed at 05/26/2018 0400 Gross per 24 hour  Intake 1657.6 ml  Output 450 ml  Net 1207.6 ml   Filed Weights   05/24/18 0559 05/25/18 0530 05/26/18 0616  Weight: 79.2 kg 78.5 kg 78.8 kg    Telemetry    NSR - Personally Reviewed  ECG    N/A  Physical Exam   GEN: No acute distress.   Neck: No JVD Cardiac: RRR, no murmurs, rubs, or gallops.  Respiratory: Clear to auscultation bilaterally. GI: Soft, nontender, non-distended  MS: No edema; No deformity. Neuro:  Nonfocal  Psych: Normal affect   Labs    Chemistry Recent Labs  Lab 05/23/18 0921 05/26/18 0501  NA 137 139  K 4.0 4.0  CL 103 105  CO2 26 24  GLUCOSE 180* 112*    BUN 17 20  CREATININE 1.06 1.09  CALCIUM 9.3 8.9  GFRNONAA >60 >60  GFRAA >60 >60  ANIONGAP 8 10     Hematology Recent Labs  Lab 05/24/18 0716 05/25/18 0642 05/26/18 0501  WBC 8.0 8.1 8.0  RBC 5.41 5.74 5.17  HGB 15.8 16.8 15.1  HCT 48.2 52.1* 46.2  MCV 89.1 90.8 89.4  MCH 29.2 29.3 29.2  MCHC 32.8 32.2 32.7  RDW 13.2 13.1 13.0  PLT 218 220 187    Cardiac Enzymes Recent Labs  Lab 05/23/18 1200 05/23/18 1916 05/24/18 0008 05/24/18 0716  TROPONINI <0.03 2.58* 4.38* 2.32*    Recent Labs  Lab 05/23/18 0948  TROPIPOC 0.12*      Radiology    No results found.  Cardiac Studies   Pending cath today   Echo 05/23/18 Study Conclusions  - Left ventricle: The cavity size was normal. Wall thickness was   normal. Systolic function was normal. The estimated ejection   fraction was in the range of 60% to 65%. Wall motion was normal;   there were no regional wall motion abnormalities. Doppler   parameters  are consistent with abnormal left ventricular   relaxation (grade 1 diastolic dysfunction). The Ee&' ratio is   between 8-15, suggesting indeterminate LV filling pressure. - Mitral valve: Mildly thickened leaflets . There was trivial   regurgitation. - Left atrium: The atrium was normal in size. - Inferior vena cava: The vessel was normal in size. The   respirophasic diameter changes were in the normal range (>= 50%),   consistent with normal central venous pressure.  Impressions:  - LVEF 60-65%, normal wall thickness, normal wall motion, grade 1   DD, indeterminate LV filling pressure, trivial MR, normal LA   size, normal IVC.  Patient Profile     67 y.o. male with a history of hypertension, hyperlipidemia, and former tobacco user over 20 years ago admitted for chest pain ruled in for NSTEMI.   Assessment & Plan    1. NSTEMI - Peak of troponin 4.3. For cath today.  - Started low-dose beta-blocker in conjunction with his chronic verapamil use which  he is utilizing for migraine prophylaxis quite successfully.  - Continue ASA, statin, BB and heparin.   2. HLD - 05/24/2018: Cholesterol 137; HDL 40; LDL Cholesterol 85; Triglycerides 61; VLDL 12  - On home Lipitor 40mg  qd. if found to have CAD, increase dose  3. HTN - BP stable on current mediations. Change as below.   4. Bradycardia - Asymptomatic, iatrogenic in part from both verapamil and metoprolol.  H does not wanted to change his verapamil.  Migraines were helped out significantly with this. - He is agree to hold Varapamil for now. Continue BB. Monitor on telemetry.   For questions or updates, please contact [place] Please consult [email or web address] for contact info under        SignedLeanor Kail, PA  05/26/2018, 8:26 AM

## 2018-05-26 NOTE — H&P (View-Only) (Signed)
Progress Note  Patient Name: Jerry [name] Date of Encounter: 05/26/2018  Primary Cardiologist: Elouise Munroe, MD   Subjective   Feeling well. No chest pain, sob or palpitations.   Inpatient Medications    Scheduled Meds: . aspirin EC  81 mg Oral Daily  . atorvastatin  40 mg Oral q1800  . hydrochlorothiazide  25 mg Oral Daily   And  . lisinopril  20 mg Oral Daily  . magnesium gluconate  250 mg Oral BID  . metoprolol tartrate  12.5 mg Oral BID  . multivitamin with minerals  1 tablet Oral Daily  . sodium chloride flush  3 mL Intravenous Q12H  . verapamil  240 mg Oral q morning - 10a   And  . verapamil  120 mg Oral QHS   Continuous Infusions: . sodium chloride    . sodium chloride 1 mL/kg/hr (05/25/18 2202)  . heparin 1,000 Units/hr (05/26/18 0400)   PRN Meds: sodium chloride, acetaminophen, ondansetron (ZOFRAN) IV, sodium chloride flush   Vital Signs    Vitals:   05/24/18 2232 05/25/18 0530 05/25/18 2043 05/26/18 0616  BP: 135/74 123/73 116/71 121/70  Pulse: 60 (!) 48 (!) 56 (!) 49  Resp:  18 18 16   Temp:  98.1 F (36.7 C) 98 F (36.7 C) 98.1 F (36.7 C)  TempSrc:  Oral Oral Oral  SpO2:  93% 96% 99%  Weight:  78.5 kg  78.8 kg  Height:        Intake/Output Summary (Last 24 hours) at 05/26/2018 0826 Last data filed at 05/26/2018 0400 Gross per 24 hour  Intake 1657.6 ml  Output 450 ml  Net 1207.6 ml   Filed Weights   05/24/18 0559 05/25/18 0530 05/26/18 0616  Weight: 79.2 kg 78.5 kg 78.8 kg    Telemetry    NSR - Personally Reviewed  ECG    N/A  Physical Exam   GEN: No acute distress.   Neck: No JVD Cardiac: RRR, no murmurs, rubs, or gallops.  Respiratory: Clear to auscultation bilaterally. GI: Soft, nontender, non-distended  MS: No edema; No deformity. Neuro:  Nonfocal  Psych: Normal affect   Labs    Chemistry Recent Labs  Lab 05/23/18 0921 05/26/18 0501  NA 137 139  K 4.0 4.0  CL 103 105  CO2 26 24  GLUCOSE 180* 112*    BUN 17 20  CREATININE 1.06 1.09  CALCIUM 9.3 8.9  GFRNONAA >60 >60  GFRAA >60 >60  ANIONGAP 8 10     Hematology Recent Labs  Lab 05/24/18 0716 05/25/18 0642 05/26/18 0501  WBC 8.0 8.1 8.0  RBC 5.41 5.74 5.17  HGB 15.8 16.8 15.1  HCT 48.2 52.1* 46.2  MCV 89.1 90.8 89.4  MCH 29.2 29.3 29.2  MCHC 32.8 32.2 32.7  RDW 13.2 13.1 13.0  PLT 218 220 187    Cardiac Enzymes Recent Labs  Lab 05/23/18 1200 05/23/18 1916 05/24/18 0008 05/24/18 0716  TROPONINI <0.03 2.58* 4.38* 2.32*    Recent Labs  Lab 05/23/18 0948  TROPIPOC 0.12*      Radiology    No results found.  Cardiac Studies   Pending cath today   Echo 05/23/18 Study Conclusions  - Left ventricle: The cavity size was normal. Wall thickness was   normal. Systolic function was normal. The estimated ejection   fraction was in the range of 60% to 65%. Wall motion was normal;   there were no regional wall motion abnormalities. Doppler   parameters  are consistent with abnormal left ventricular   relaxation (grade 1 diastolic dysfunction). The Ee&' ratio is   between 8-15, suggesting indeterminate LV filling pressure. - Mitral valve: Mildly thickened leaflets . There was trivial   regurgitation. - Left atrium: The atrium was normal in size. - Inferior vena cava: The vessel was normal in size. The   respirophasic diameter changes were in the normal range (>= 50%),   consistent with normal central venous pressure.  Impressions:  - LVEF 60-65%, normal wall thickness, normal wall motion, grade 1   DD, indeterminate LV filling pressure, trivial MR, normal LA   size, normal IVC.  Patient Profile     67 y.o. male with a history of hypertension, hyperlipidemia, and former tobacco user over 20 years ago admitted for chest pain ruled in for NSTEMI.   Assessment & Plan    1. NSTEMI - Peak of troponin 4.3. For cath today.  - Started low-dose beta-blocker in conjunction with his chronic verapamil use which  he is utilizing for migraine prophylaxis quite successfully.  - Continue ASA, statin, BB and heparin.   2. HLD - 05/24/2018: Cholesterol 137; HDL 40; LDL Cholesterol 85; Triglycerides 61; VLDL 12  - On home Lipitor 40mg  qd. if found to have CAD, increase dose  3. HTN - BP stable on current mediations. Change as below.   4. Bradycardia - Asymptomatic, iatrogenic in part from both verapamil and metoprolol.  H does not wanted to change his verapamil.  Migraines were helped out significantly with this. - He is agree to hold Varapamil for now. Continue BB. Monitor on telemetry.   For questions or updates, please contact [place] Please consult [email or web address] for contact info under        SignedLeanor Kail, PA  05/26/2018, 8:26 AM

## 2018-05-27 ENCOUNTER — Telehealth: Payer: Self-pay | Admitting: Internal Medicine

## 2018-05-27 ENCOUNTER — Encounter (HOSPITAL_COMMUNITY): Payer: Self-pay | Admitting: Cardiology

## 2018-05-27 DIAGNOSIS — R7303 Prediabetes: Secondary | ICD-10-CM

## 2018-05-27 LAB — CBC
HEMATOCRIT: 42.7 % (ref 39.0–52.0)
HEMOGLOBIN: 14 g/dL (ref 13.0–17.0)
MCH: 29.6 pg (ref 26.0–34.0)
MCHC: 32.8 g/dL (ref 30.0–36.0)
MCV: 90.3 fL (ref 80.0–100.0)
Platelets: 201 10*3/uL (ref 150–400)
RBC: 4.73 MIL/uL (ref 4.22–5.81)
RDW: 12.8 % (ref 11.5–15.5)
WBC: 6.9 10*3/uL (ref 4.0–10.5)

## 2018-05-27 LAB — BASIC METABOLIC PANEL
ANION GAP: 6 (ref 5–15)
BUN: 21 mg/dL (ref 8–23)
CALCIUM: 8.7 mg/dL — AB (ref 8.9–10.3)
CHLORIDE: 107 mmol/L (ref 98–111)
CO2: 25 mmol/L (ref 22–32)
Creatinine, Ser: 1.19 mg/dL (ref 0.61–1.24)
GFR calc non Af Amer: >60 mL/min (ref >60)
Glucose, Bld: 80 mg/dL (ref 70–99)
POTASSIUM: 3.7 mmol/L (ref 3.5–5.1)
Sodium: 138 mmol/L (ref 135–145)

## 2018-05-27 MED ORDER — ATORVASTATIN CALCIUM 80 MG PO TABS: 80.0000 mg | ORAL_TABLET | Freq: Every day | ORAL | 0 refills | Status: AC

## 2018-05-27 MED ORDER — TICAGRELOR 90 MG PO TABS: 90.0000 mg | ORAL_TABLET | Freq: Two times a day (BID) | ORAL | 0 refills | Status: DC

## 2018-05-27 MED ORDER — ATORVASTATIN CALCIUM 80 MG PO TABS: 80.0000 mg | ORAL_TABLET | Freq: Every day | ORAL | Status: DC

## 2018-05-27 MED ORDER — ASPIRIN 81 MG PO CHEW: 81.0000 mg | CHEWABLE_TABLET | Freq: Every day | ORAL | 0 refills | Status: AC

## 2018-05-27 MED ORDER — METOPROLOL TARTRATE 25 MG PO TABS: 12.5000 mg | ORAL_TABLET | Freq: Two times a day (BID) | ORAL | 0 refills | Status: DC

## 2018-05-27 NOTE — Progress Notes (Signed)
CARDIAC REHAB PHASE I   PRE:  Rate/Rhythm: 64 SR  BP:  Supine: 142/58  Sitting:   Standing:    SaO2: 97%RA  MODE:  Ambulation: 800 ft   POST:  Rate/Rhythm: 81 SR  BP:  Supine:   Sitting: 143/58  Standing:    SaO2: 98%RA 0820-0920 Pt walked 800 ft on RA with steady gait and no CP. Tolerated well. MI education completed with pt and wife who voiced understanding. Stressed importance of brilinta with stent. Needs to see case manager. Has brilinta card but not aware of his cost. Reviewed carb counting as A1C at 6. Discussed heart healthy and low sodium food choices. Reviewed NTG use, ex ed and CRP 2. Referred to [place]   Graylon Good, RN BSN  05/27/2018 9:16 AM

## 2018-05-27 NOTE — Discharge Summary (Signed)
Discharge Summary  Jerry [name] YIR:[id number] DOB: Aug 31, 1950  PCP: Jerry [name], JerryMarlou Sa, MD  Admit date: 05/23/2018 Discharge date: 05/27/2018  Time spent: 35 mins  Recommendations for Outpatient Follow-up:  1. PCP 2. Cardiology  Discharge Diagnoses:  Active Hospital Problems   Diagnosis Date Noted  . Non-ST elevation (NSTEMI) myocardial infarction ([place])   . Chest pain 05/23/2018  . Essential hypertension 05/23/2018  . Hyperlipidemia 05/23/2018    Resolved Hospital Problems  No resolved problems to display.    Discharge Condition: Stable  Diet recommendation: Heart healthy, mod carb  Vitals:   05/27/18 0813 05/27/18 1214  BP: (!) 142/58 135/63  Pulse: 67 (!) 47  Resp:    Temp: 97.6 F (36.4 C) 98.2 F (36.8 C)  SpO2:  96%    History of present illness:  [name],with past medical history of hypertension, hyperlipidemia, presents with chest pain, midsternal, radiating to bilateral upper extremity, accompanied by some dyspnea, diaphoresis. Pt took 4 baby aspirin at home, chest pain resolved in route to the hospital, he reports a history of coronary artery disease in family, father had first open heart surgery at age 81. Pt used to smoke a pipe, quit about 20 years ago. In ED, EKG showed flattening T wave, no old one to compare, troponins point-of-care in ED was 0.12. Cardiology consulted. Pt admitted for further management.  Today, pt denies any new complaints. Stable to be d/c from cardiology standpoint as well as mine. Denies any chest pain, SOB, diaphoresis, dizziness.   Hospital Course:  Active Problems:   Chest pain   Essential hypertension   Hyperlipidemia   Non-ST elevation (NSTEMI) myocardial infarction [facility])  NSTEMI/CAD s/p DES in RCA on 05/26/18 Currently chest pain-free Troponin peaked to 4.38, trended down EKG with no acute ST changes Echo showed EF of 60 to 65%, no regional wall motion abnormalities, grade 1 diastolic  dysfunction Cardiology on board: LHC done on 05/26/18 showed: Diffuse moderate mid and distal LAD disease. The vessel is calcified. There is a small second diagonal that is occluded. 99% proximal to mid RCA s/p DES. DAPT for one year. Aggressive risk factor modification and medical management of the residual disease in the LAD. PCI of this vessel would require atherectomy and extensive stenting.  Start Ticagrelor 90 mg BID, continue with aspirin, statins, metoprolol Follow up with cardiologist  Hypertension Stable Continue HCTZ, lisinopril  Hyperlipidemia LDL 85  Continue statins  Pre-diabetes A1c is 6.0 Lifestyle modifications advised Follow up with PCP  Migraine Stable Pt was on home verapamil, currently discontinued, as pt was started on metoprolol (bradycardic) If migraine re-occurs, pt should follow up with PCP   Procedures:  LHC on 05/26/18   Consultations:  Cardiology   Discharge Exam: BP 135/63   Pulse (!) 47   Temp 98.2 F (36.8 C) (Oral)   Resp 18   Ht 5\' 10"  (1.778 m)   Wt 78 kg   SpO2 96%   BMI 24.67 kg/m   General: NAD Cardiovascular: S1, S2 present  Respiratory: CTAB  Discharge Instructions You were cared for by a hospitalist during your hospital stay. If you have any questions about your discharge medications or the care you received while you were in the hospital after you are discharged, you can call the unit and asked to speak with the hospitalist on call if the hospitalist that took care of you is not available. Once you are discharged, your primary care physician will handle any further medical issues. Please note  that NO REFILLS for any discharge medications will be authorized once you are discharged, as it is imperative that you return to your primary care physician (or establish a relationship with a primary care physician if you do not have one) for your aftercare needs so that they can reassess your need for medications and monitor your lab  values.  Discharge Instructions    AMB Referral to Cardiac Rehabilitation - Phase II   Complete by:  As directed    Diagnosis:   NSTEMI Coronary Stents     Amb Referral to Cardiac Rehabilitation   Complete by:  As directed    Diagnosis:   NSTEMI Coronary Stents       Allergies as of 05/27/2018      Reactions   Ciprofloxacin Other (See Comments)   Joint and muscle stiffness   Tamsulosin Other (See Comments)   Closed up nasal passages and couldn't breathe   Chloraprep One Step [chlorhexidine Gluconate] Rash      Medication List    STOP taking these medications   HYDROcodone-acetaminophen 5-325 MG tablet Commonly known as:  NORCO/VICODIN   verapamil 240 MG CR tablet Commonly known as:  CALAN-SR     TAKE these medications   aspirin 81 MG chewable tablet Chew 1 tablet (81 mg total) by mouth daily. Start taking on:  05/28/2018   atorvastatin 80 MG tablet Commonly known as:  LIPITOR Take 1 tablet (80 mg total) by mouth daily at 6 PM. What changed:    medication strength  how much to take  when to take this   ketoconazole 2 % cream Commonly known as:  NIZORAL Apply 1 application topically daily as needed (for rosacea).   lisinopril-hydrochlorothiazide 20-25 MG tablet Commonly known as:  PRINZIDE,ZESTORETIC Take 1 tablet by mouth daily.   Magnesium 250 MG Tabs Take 250 mg by mouth 2 (two) times daily.   metoprolol tartrate 25 MG tablet Commonly known as:  LOPRESSOR Take 0.5 tablets (12.5 mg total) by mouth 2 (two) times daily.   multivitamin tablet Take 1 tablet by mouth daily.   Omega-3 1000 MG Caps Take 300 mg by mouth daily.   ticagrelor 90 MG Tabs tablet Commonly known as:  BRILINTA Take 1 tablet (90 mg total) by mouth every 12 (twelve) hours.      Allergies  Allergen Reactions  . Ciprofloxacin Other (See Comments)    Joint and muscle stiffness  . Tamsulosin Other (See Comments)    Closed up nasal passages and couldn't breathe  . Chloraprep  One Step [Chlorhexidine Gluconate] Rash   Follow-up Information    Jerry Munroe, MD Follow up on 06/09/2018.   Specialty:  Cardiology Why:  at 9:20am for your follow up appt.  Contact information: [address] [place] [address] [phone]        Jerry [name], JerryMarlou Sa, MD. Schedule an appointment as soon as possible for a visit in 1 week(s).   Specialty:  Family Medicine Contact information: [address] [place] [address] [phone]            The results of significant diagnostics from this hospitalization (including imaging, microbiology, ancillary and laboratory) are listed below for reference.    Significant Diagnostic Studies: Dg Chest 2 View  Result Date: 05/23/2018 CLINICAL DATA:  Chest pain. EXAM: CHEST - 2 VIEW COMPARISON:  Radiographs of October 14, 2015. FINDINGS: The heart size and mediastinal contours are within normal limits. Both lungs are clear. No pneumothorax  or pleural effusion is noted. The visualized skeletal structures are unremarkable. IMPRESSION: No active cardiopulmonary disease. Electronically Signed   By: Marijo Conception, M.D.   On: 05/23/2018 09:56    Microbiology: No results found for this or any previous visit (from the past 240 hour(s)).   Labs: Basic Metabolic Panel: Recent Labs  Lab 05/23/18 0921 05/26/18 0501 05/27/18 0344  NA 137 139 138  K 4.0 4.0 3.7  CL 103 105 107  CO2 26 24 25   GLUCOSE 180* 112* 80  BUN 17 20 21   CREATININE 1.06 1.09 1.19  CALCIUM 9.3 8.9 8.7*   Liver Function Tests: No results for input(s): AST, ALT, ALKPHOS, BILITOT, PROT, ALBUMIN in the last 168 hours. No results for input(s): LIPASE, AMYLASE in the last 168 hours. No results for input(s): AMMONIA in the last 168 hours. CBC: Recent Labs  Lab 05/23/18 0921 05/24/18 0716 05/25/18 0642 05/26/18 0501 05/27/18 0344  WBC 6.4 8.0 8.1 8.0 6.9  HGB 16.5 15.8 16.8 15.1 14.0  HCT 49.8 48.2 52.1* 46.2 42.7    MCV 89.1 89.1 90.8 89.4 90.3  PLT 195 218 220 187 201   Cardiac Enzymes: Recent Labs  Lab 05/23/18 1200 05/23/18 1916 05/24/18 0008 05/24/18 0716  TROPONINI <0.03 2.58* 4.38* 2.32*   BNP: BNP (last 3 results) No results for input(s): BNP in the last 8760 hours.  ProBNP (last 3 results) No results for input(s): PROBNP in the last 8760 hours.  CBG: No results for input(s): GLUCAP in the last 168 hours.     Signed:  Alma Friendly, MD [facility] 05/27/2018, 12:32 PM

## 2018-05-27 NOTE — Progress Notes (Addendum)
Progress Note  Patient Name: Jerry [name] Date of Encounter: 05/27/2018  Primary Cardiologist: Elouise Munroe, MD  Subjective   Feeling well this morning.   Inpatient Medications    Scheduled Meds: . aspirin  81 mg Oral Daily  . atorvastatin  40 mg Oral q1800  . hydrochlorothiazide  25 mg Oral Daily   And  . lisinopril  20 mg Oral Daily  . magnesium gluconate  250 mg Oral BID  . metoprolol tartrate  12.5 mg Oral BID  . multivitamin with minerals  1 tablet Oral Daily  . sodium chloride flush  3 mL Intravenous Q12H  . ticagrelor  90 mg Oral Q12H   Continuous Infusions: . sodium chloride     PRN Meds: sodium chloride, acetaminophen, ondansetron (ZOFRAN) IV, sodium chloride flush   Vital Signs    Vitals:   05/26/18 2300 05/27/18 0200 05/27/18 0405 05/27/18 0813  BP: (!) 106/48 110/63 (!) 104/54 (!) 142/58  Pulse:   (!) 47 67  Resp: 16 18 18    Temp:   98.4 F (36.9 C) 97.6 F (36.4 C)  TempSrc:   Oral Oral  SpO2: 96%  96%   Weight:   78 kg   Height:        Intake/Output Summary (Last 24 hours) at 05/27/2018 0932 Last data filed at 05/27/2018 2706 Gross per 24 hour  Intake 1005.77 ml  Output 1550 ml  Net -544.23 ml   Filed Weights   05/26/18 0616 05/26/18 2022 05/27/18 0405  Weight: 78.8 kg 78.8 kg 78 kg    Telemetry    SB - Personally Reviewed  ECG    SB - Personally Reviewed  Physical Exam   General: Well developed, well nourished, male appearing in no acute distress. Head: Normocephalic, atraumatic.  Neck: Supple without bruits, JVD. Lungs:  Resp regular and unlabored, CTA. Heart: RRR, S1, S2, no murmur; no rub. Abdomen: Soft, non-tender, non-distended with normoactive bowel sounds. Extremities: No clubbing, cyanosis, edema. Distal pedal pulses are 2+ bilaterally. Right radial site stable.  Neuro: Alert and oriented X 3. Moves all extremities spontaneously. Psych: Normal affect.  Labs    Chemistry Recent Labs  Lab 05/23/18 0921  05/26/18 0501 05/27/18 0344  NA 137 139 138  K 4.0 4.0 3.7  CL 103 105 107  CO2 26 24 25   GLUCOSE 180* 112* 80  BUN 17 20 21   CREATININE 1.06 1.09 1.19  CALCIUM 9.3 8.9 8.7*  GFRNONAA >60 >60 >60  GFRAA >60 >60 >60  ANIONGAP 8 10 6      Hematology Recent Labs  Lab 05/25/18 0642 05/26/18 0501 05/27/18 0344  WBC 8.1 8.0 6.9  RBC 5.74 5.17 4.73  HGB 16.8 15.1 14.0  HCT 52.1* 46.2 42.7  MCV 90.8 89.4 90.3  MCH 29.3 29.2 29.6  MCHC 32.2 32.7 32.8  RDW 13.1 13.0 12.8  PLT 220 187 201    Cardiac Enzymes Recent Labs  Lab 05/23/18 1200 05/23/18 1916 05/24/18 0008 05/24/18 0716  TROPONINI <0.03 2.58* 4.38* 2.32*    Recent Labs  Lab 05/23/18 0948  TROPIPOC 0.12*     BNPNo results for input(s): BNP, PROBNP in the last 168 hours.   DDimer No results for input(s): DDIMER in the last 168 hours.    Radiology    No results found.  Cardiac Studies   Cath: 05/26/18   Prox RCA to Mid RCA lesion is 99% stenosed.  Prox LAD lesion is 50% stenosed.  Mid LAD lesion  is 70% stenosed.  Mid LAD to Dist LAD lesion is 75% stenosed.  Ost 2nd Diag lesion is 100% stenosed.  LV end diastolic pressure is normal.  Post intervention, there is a 0% residual stenosis.  A drug-eluting stent was successfully placed using a STENT SYNERGY DES 2.5X20.   1. 2  Vessel obstructive CAD     - diffuse moderate mid and distal LAD disease. The vessel is calcified. There is a small second diagonal that is occluded.      - 99% proximal to mid RCA 2. Normal LVEDP 3. Successful PCI of the culprit lesion in the RCA with DES  Plan: DAPT for one year. Aggressive risk factor modification and medical management of the residual disease in the LAD. PCI of this vessel would require atherectomy and extensive stenting.   Recommend uninterrupted dual antiplatelet therapy with Aspirin 81mg  daily and Ticagrelor 90mg  twice daily for a minimum of 12 months (ACS - Class I recommendation).  TTE:  05/23/18  Study Conclusions  - Left ventricle: The cavity size was normal. Wall thickness was   normal. Systolic function was normal. The estimated ejection   fraction was in the range of 60% to 65%. Wall motion was normal;   there were no regional wall motion abnormalities. Doppler   parameters are consistent with abnormal left ventricular   relaxation (grade 1 diastolic dysfunction). The Ee&' ratio is   between 8-15, suggesting indeterminate LV filling pressure. - Mitral valve: Mildly thickened leaflets . There was trivial   regurgitation. - Left atrium: The atrium was normal in size. - Inferior vena cava: The vessel was normal in size. The   respirophasic diameter changes were in the normal range (>= 50%),   consistent with normal central venous pressure.  Impressions:  - LVEF 60-65%, normal wall thickness, normal wall motion, grade 1   DD, indeterminate LV filling pressure, trivial MR, normal LA   size, normal IVC.   Patient Profile     67 y.o. male with a history of hypertension, hyperlipidemia, and former tobacco user over 20 years ago admitted for chest pain ruled in for NSTEMI.  Assessment & Plan    1. NSTEMI - Peak of troponin 4.3. Underwent cath noted above with PCI of the RCA with DES x1. Planned for DAPT with ASA/Brilinta for at least 12 months. No chest pain overnight. Worked well with cardiac rehab. Echo with normal EF and no WMA noted.  - Continue ASA, statin, BB  - will arrange follow up appt.   2. HLD - 05/24/2018: Cholesterol 137; HDL 40; LDL Cholesterol 85; Triglycerides 61; VLDL 12  - On home Lipitor 40mg  qd, will further increase to 80mg  daily.   3. HTN - BP stable on current mediations.    4. Bradycardia - HR stable on BB therapy. He is asymptomatic. Unable to further titrate given his bradycardia.   Signed, Reino Bellis, NP  05/27/2018, 9:32 AM  Pager # [phone]   For questions or updates, please contact [place] Please consult  [email or web address] for contact info under Cardiology/STEMI.   Patient seen and examined and history reviewed. Agree with above findings and plan. He is doing well ambulating in halls. No chest pain. BP is stable. Radial site looks good.   He does have moderate diffuse calcific disease in the LAD. Will see how he does on medical therapy. If he has more angina we could attempt PCI but this would require atherectomy and rather extensive stenting of the LAD. Will continue  metoprolol, DAPT, high dose statin. He is stable for DC today. We will arrange follow up in our office.   Vasti Yagi [place], [place] 05/27/2018 10:37 AM

## 2018-05-27 NOTE — Progress Notes (Signed)
TR BAND REMOVAL  LOCATION:    right radial  DEFLATED PER PROTOCOL:    Yes.    TIME BAND OFF / DRESSING APPLIED:    2245   SITE UPON ARRIVAL:    Level 0  SITE AFTER BAND REMOVAL:    Level 0  CIRCULATION SENSATION AND MOVEMENT:    Within Normal Limits   Yes.    COMMENTS:   Pt.tolerated procedure well

## 2018-05-27 NOTE — Telephone Encounter (Signed)
New Message   [facility] appointment made on 06/09/18 at 9:20am with Dr. Margaretann Loveless per Mendel Ryder

## 2018-05-27 NOTE — Telephone Encounter (Signed)
Currently admitted.

## 2018-05-27 NOTE — Care Management (Signed)
#    2.  S/W CHERI  @ [facility] # [phone]   BRILINTA  90 MG BID COVER- YES CO-PAY- $ 45.00 TIER- 4 DRUG PRIOR APPROVAL- NO  PREFERRED PHARMACY : [facility] M/O 90 DAY SUPPLY M/O $ 112.50

## 2018-05-27 NOTE — Care Management Note (Signed)
Case Management Note  Patient Details  Name: Jerry [name] MRN: [id number] Date of Birth: 05/08/1951  Subjective/Objective:   From home, s/p stent intervention, will be on brilinta, has 30 day coupon , NCM informed patient of his copay of 45.00 .   Patien wants to use his pharmacy for the [place].                  Action/Plan: DC home when ready.  Expected Discharge Date:                  Expected Discharge Plan:  Home/Self Care  In-House Referral:     Discharge planning Services  CM Consult, Medication Assistance  Post Acute Care Choice:    Choice offered to:     DME Arranged:    DME Agency:     HH Arranged:    HH Agency:     Status of Service:  Completed, signed off  If discussed at [employer] of Stay Meetings, dates discussed:    Additional Comments:  Zenon Mayo, RN 05/27/2018, 12:04 PM

## 2018-05-29 NOTE — Telephone Encounter (Signed)
Patient contacted regarding discharge from [facility] on Tuesday, 05/27/18.  Patient understands to follow up with provider Dr. Margaretann Loveless on 06/09/2018 at 9:20 am at [facility]. Patient understands discharge instructions? Yes. Patient understands medications and regiment? Yes. Patient understands to bring all medications to this visit? Yes.  Patient states the bandage placed on his wrist after the heart catheterization needed to be changed but it looked like it was stuck to the scab and he wanted to know the best way to remove it. Advised patient to moisten the bandage and area with a little bit of water and gently remove it. Patient verbalized understanding and advised to contact our office with any further questions.

## 2018-06-04 DIAGNOSIS — I2510 Atherosclerotic heart disease of native coronary artery without angina pectoris: Secondary | ICD-10-CM | POA: Diagnosis not present

## 2018-06-04 DIAGNOSIS — I10 Essential (primary) hypertension: Secondary | ICD-10-CM | POA: Diagnosis not present

## 2018-06-04 DIAGNOSIS — E7800 Pure hypercholesterolemia, unspecified: Secondary | ICD-10-CM | POA: Diagnosis not present

## 2018-06-06 ENCOUNTER — Encounter: Payer: Self-pay | Admitting: Student

## 2018-06-09 ENCOUNTER — Encounter: Payer: Self-pay | Admitting: Internal Medicine

## 2018-06-09 ENCOUNTER — Ambulatory Visit (INDEPENDENT_AMBULATORY_CARE_PROVIDER_SITE_OTHER): Payer: Medicare Other | Admitting: Internal Medicine

## 2018-06-09 VITALS — BP 104/68 | HR 48 | Ht 70.5 in | Wt 175.4 lb

## 2018-06-09 DIAGNOSIS — I252 Old myocardial infarction: Secondary | ICD-10-CM | POA: Diagnosis not present

## 2018-06-09 DIAGNOSIS — E785 Hyperlipidemia, unspecified: Secondary | ICD-10-CM

## 2018-06-09 DIAGNOSIS — Z955 Presence of coronary angioplasty implant and graft: Secondary | ICD-10-CM | POA: Diagnosis not present

## 2018-06-09 DIAGNOSIS — I10 Essential (primary) hypertension: Secondary | ICD-10-CM

## 2018-06-09 MED ORDER — METOPROLOL TARTRATE 25 MG PO TABS: 12.5000 mg | ORAL_TABLET | Freq: Two times a day (BID) | ORAL | 3 refills | Status: DC

## 2018-06-09 MED ORDER — NITROGLYCERIN 0.4 MG SL SUBL: 0.4000 mg | SUBLINGUAL_TABLET | SUBLINGUAL | 3 refills | Status: DC | PRN

## 2018-06-09 NOTE — Consult Note (Signed)
Cardiology Office Note:    Date:  06/09/2018   ID:  Jerry [name], DOB 1950-08-26, MRN [id number]  PCP:  Aurea Graff.Marlou Sa, MD  Cardiologist:  Elouise Munroe, MD  Electrophysiologist:  None   Referring MD: Aurea Graff.Marlou Sa, MD   NSTEMI, status post PCI to the RCA with residual mid LAD disease.  History of Present Illness:    Jerry [name] is a 67 y.o. male with a past medical history of hypertension, hyperlipidemia, iatrogenic bradycardia (on verapamil for migraines), who presents today for follow up after NSTEMI with PCI to the RCA.  Mr. Desa saw cardiologist several years ago and had a normal stress test at that time per his report.  He has overall good health.  On the morning of 05/23/2018 he presented with chest pain that he noticed after eating breakfast.  His initial troponin was negative however his subsequent troponin was elevated and peaked at the third troponin at 4.38.  He was felt to have an end STEMI and was taken to the cardiac catheterization lab during his hospital stay.  His angiogram demonstrated diffuse moderate mid and distal LAD disease with a calcified vessel and an occluded small second diagonal.  His proximal to mid RCA with 99% stenosed, and this was felt to be the culprit vessel and was intervened upon.  He received PCI with drug-eluting stent.  He was placed on aspirin 81 mg daily and to ticagrelor (Brilinta) 90 mg twice daily.  His echocardiogram during his chest pain demonstrated an ejection fraction of 60 to 65% with normal wall motion no significant valvular heart disease.  He has done well since his hospitalization and has tolerated his medications well.  He walks on his own and rates a perceived exertion of approximately 6-7 on the Borg scale.  He has not yet enrolled in cardiac rehab and thought he needed to wait until our visit to determine that.  I have encouraged him to go ahead and call cardiac rehab to set up his visits, as this is an interval part  of post MI care.  I think he will do quite well in cardiac rehab as he is essentially performing his own cardiac rehab at home walking on a daily basis.  He denies recurrent chest pain, exertional shortness of breath, palpitations, PND, orthopnea, or leg swelling. Patient is a former tobacco user. He states he smoked a pipe for around 20 years but quit over 20 years ago. He denies any alcohol or illicit drug use. Patient has a significant family history of heart disease on his fathers side of the family. His father had CAD and has two CABGs, the first one at the age of 66 years old. All of his paternal uncles also have CAD. His paternal grandfather had a stroke.   Past Medical History:  Diagnosis Date  . High cholesterol   . History of kidney stones   . Hypertension   . Migraine    "none since ~ 2014 or before" (05/23/2018)    Past Surgical History:  Procedure Laterality Date  . CORONARY STENT INTERVENTION N/A 05/26/2018   Procedure: CORONARY STENT INTERVENTION;  Surgeon: Martinique, Peter M, MD;  Location: [facility];  Service: Cardiovascular;  Laterality: N/A;  . CYSTOSCOPY W/ STONE MANIPULATION  2017  . HERNIA REPAIR    . INGUINAL HERNIA REPAIR Left 03/07/2016   Procedure: LEFT INGUINAL HERNIA REPAIR;  Surgeon: Donnie Mesa, MD;  Location: [facility];  Service: General;  Laterality: Left;  .  INSERTION OF MESH Left 03/07/2016   Procedure: INSERTION OF MESH;  Surgeon: Donnie Mesa, MD;  Location: [facility];  Service: General;  Laterality: Left;  . LEFT HEART CATH AND CORONARY ANGIOGRAPHY N/A 05/26/2018   Procedure: LEFT HEART CATH AND CORONARY ANGIOGRAPHY;  Surgeon: Martinique, Peter M, MD;  Location: [facility];  Service: Cardiovascular;  Laterality: N/A;  . LITHOTRIPSY  1990s    Current Medications: Current Meds  Medication Sig  . aspirin 81 MG chewable tablet Chew 1 tablet (81 mg total) by mouth daily.  Marland Kitchen atorvastatin (LIPITOR) 80 MG tablet Take 1 tablet (80 mg total) by mouth daily  at 6 PM.  . ketoconazole (NIZORAL) 2 % cream Apply 1 application topically daily as needed (for rosacea).  . lisinopril-hydrochlorothiazide (PRINZIDE,ZESTORETIC) 20-25 MG tablet Take 1 tablet by mouth daily.   . Magnesium 250 MG TABS Take 250 mg by mouth 2 (two) times daily.  . metoprolol tartrate (LOPRESSOR) 25 MG tablet Take 0.5 tablets (12.5 mg total) by mouth 2 (two) times daily.  . Multiple Vitamin (MULTIVITAMIN) tablet Take 1 tablet by mouth daily.  . Omega-3 1000 MG CAPS Take 300 mg by mouth daily.  . ticagrelor (BRILINTA) 90 MG TABS tablet Take 1 tablet (90 mg total) by mouth every 12 (twelve) hours.  . [DISCONTINUED] metoprolol tartrate (LOPRESSOR) 25 MG tablet Take 0.5 tablets (12.5 mg total) by mouth 2 (two) times daily.     Allergies:   Ciprofloxacin; Tamsulosin; and Chloraprep one step [chlorhexidine gluconate]   Social History   Socioeconomic History  . Marital status: Married    Spouse name: Not on file  . Number of children: Not on file  . Years of education: Not on file  . Highest education level: Not on file  Occupational History  . Not on file  Social Needs  . Financial resource strain: Not on file  . Food insecurity:    Worry: Not on file    Inability: Not on file  . Transportation needs:    Medical: Not on file    Non-medical: Not on file  Tobacco Use  . Smoking status: Former Smoker    Years: 20.00    Types: Pipe    Last attempt to quit: 02/28/2000    Years since quitting: 18.2  . Smokeless tobacco: Never Used  Substance and Sexual Activity  . Alcohol use: Yes    Comment: 05/23/2018 "nothing since ~ 10/2017"  . Drug use: Never  . Sexual activity: Not on file  Lifestyle  . Physical activity:    Days per week: Not on file    Minutes per session: Not on file  . Stress: Not on file  Relationships  . Social connections:    Talks on phone: Not on file    Gets together: Not on file    Attends religious service: Not on file    Active member of club or  organization: Not on file    Attends meetings of clubs or organizations: Not on file    Relationship status: Not on file  Other Topics Concern  . Not on file  Social History Narrative  . Not on file     Family History: The patient's family history is documented in the history of present illness.  ROS:   Please see the history of present illness.    All other systems reviewed and are negative.  EKGs/Labs/Other Studies Reviewed:    The following studies were reviewed today:  EKG:  EKG is  ordered today.  The ekg ordered today demonstrates sinus bradycardia with a ventricular rate of 48.  Nonspecific T wave abnormality is noted in lead III and aVF.  Recent Labs: 05/27/2018: BUN 21; Creatinine, Ser 1.19; Hemoglobin 14.0; Platelets 201; Potassium 3.7; Sodium 138  Recent Lipid Panel    Component Value Date/Time   CHOL 137 05/24/2018 0716   TRIG 61 05/24/2018 0716   HDL 40 (L) 05/24/2018 0716   CHOLHDL 3.4 05/24/2018 0716   VLDL 12 05/24/2018 0716   LDLCALC 85 05/24/2018 0716     Physical Exam:    VS:  BP 104/68 (BP Location: Left Arm, Patient Position: Sitting, Cuff Size: Normal)   Pulse (!) 48   Ht 5' 10.5" (1.791 m)   Wt 175 lb 6.4 oz (79.6 kg)   BMI 24.81 kg/m      Wt Readings from Last 3 Encounters:  06/09/18 175 lb 6.4 oz (79.6 kg)  05/27/18 171 lb 15.3 oz (78 kg)  03/07/16 182 lb 13 oz (82.9 kg)     GEN: Well nourished, well developed in no acute distress HEENT: Normal NECK: No JVD; No carotid bruits LYMPHATICS: No lymphadenopathy CARDIAC: Regular rhythm, bradycardic, no murmurs, rubs, gallops RESPIRATORY:  Clear to auscultation without rales, wheezing or rhonchi  ABDOMEN: Soft, non-tender, non-distended MUSCULOSKELETAL:  No edema; No deformity  SKIN: Warm and dry NEUROLOGIC:  Alert and oriented x 3 PSYCHIATRIC:  Normal affect   ASSESSMENT:    1. Status post non-ST elevation myocardial infarction (NSTEMI)   2. Essential hypertension   3.  Hyperlipidemia, unspecified hyperlipidemia type   4. Status post insertion of drug-eluting stent into right coronary artery for coronary artery disease    PLAN:    In order of problems listed above:  Mr. Demelo is doing quite well status post NSTEMI and has had no recurrent chest pain.  He is on an excellent medical program including aspirin 81 mg daily and to ticagrelor 90 mg twice daily. I have instructed the patient that dual antiplatelet therapy should be taken for 1 year without interruption.  We have discussed the consequences of interrupted dual antiplatelet therapy and the risk for in-stent thrombosis.  He is also taking atorvastatin 80 mg daily, lisinopril hydrochlorothiazide 20-25 mg daily, magnesium supplementation for leg cramps, metoprolol tartrate 12.5 mg twice daily.  He should continue this medical program.  He has had asymptomatic sinus bradycardia, and he tracks his blood pressure and heart rate on a daily basis with a home chart.  I will see the patient back in 3 months to ensure we are continuing to have asymptomatic bradycardia and to follow-up on progress in cardiac rehab. We have provided a prescription for nitroglycerin today should he experience chest pain.  He inquired about a heart healthy diet and I encouraged him to follow a heart healthy diet such as the Mediterranean diet.  Medication Adjustments/Labs and Tests Ordered: Current medicines are reviewed at length with the patient today.  Concerns regarding medicines are outlined above.  Orders Placed This Encounter  Procedures  . EKG 12-Lead   Meds ordered this encounter  Medications  . nitroGLYCERIN (NITROSTAT) 0.4 MG SL tablet    Sig: Place 1 tablet (0.4 mg total) under the tongue every 5 (five) minutes as needed for chest pain.    Dispense:  25 tablet    Refill:  3  . metoprolol tartrate (LOPRESSOR) 25 MG tablet    Sig: Take 0.5 tablets (12.5 mg total) by mouth 2 (two) times  daily.    Dispense:  180 tablet      Refill:  3    Patient Instructions  Medication Instructions:  Your physician recommends that you continue on your current medications as directed. Please refer to the Current Medication list given to you today.  If you need a refill on your cardiac medications before your next appointment, please call your pharmacy.   Lab work: None ordered  Testing/Procedures: None orered  Follow-Up: At [facility], you and your health needs are our priority.  As part of our continuing mission to provide you with exceptional heart care, we have created designated Provider Care Teams.  These Care Teams include your primary Cardiologist (physician) and Advanced Practice Providers (APPs -  Physician Assistants and Nurse Practitioners) who all work together to provide you with the care you need, when you need it. You will need a follow up appointment in 3 months.  Please call our office 2 months in advance to schedule this appointment.  You may see Elouise Munroe, MD or one of the following Advanced Practice Providers on your designated Care Team:   Rosaria Ferries, PA-C . Jory Sims, DNP, ANP  Any Other Special Instructions Will Be Listed Below (If Applicable). No strenuous activity for the next 4 -6 weeks. No heavy lifting. Ok to stat Cardiac Rehab   Nitroglycerin sublingual tablets What is this medicine? NITROGLYCERIN (nye troe GLI ser in) is a type of vasodilator. It relaxes blood vessels, increasing the blood and oxygen supply to your heart. This medicine is used to relieve chest pain caused by angina. It is also used to prevent chest pain before activities like climbing stairs, going outdoors in cold weather, or sexual activity. This medicine may be used for other purposes; ask your health care provider or pharmacist if you have questions. COMMON BRAND NAME(S): Nitroquick, Nitrostat, Nitrotab What should I tell my health care provider before I take this medicine? They need to know if  you have any of these conditions: -anemia -head injury, recent stroke, or bleeding in the brain -liver disease -previous heart attack -an unusual or allergic reaction to nitroglycerin, other medicines, foods, dyes, or preservatives -pregnant or trying to get pregnant -breast-feeding How should I use this medicine? Take this medicine by mouth as needed. At the first sign of an angina attack (chest pain or tightness) place one tablet under your tongue. You can also take this medicine 5 to 10 minutes before an event likely to produce chest pain. Follow the directions on the prescription label. Let the tablet dissolve under the tongue. Do not swallow whole. Replace the dose if you accidentally swallow it. It will help if your mouth is not dry. Saliva around the tablet will help it to dissolve more quickly. Do not eat or drink, smoke or chew tobacco while a tablet is dissolving. If you are not better within 5 minutes after taking ONE dose of nitroglycerin, call 9-1-1 immediately to seek emergency medical care. Do not take more than 3 nitroglycerin tablets over 15 minutes. If you take this medicine often to relieve symptoms of angina, your doctor or health care professional may provide you with different instructions to manage your symptoms. If symptoms do not go away after following these instructions, it is important to call 9-1-1 immediately. Do not take more than 3 nitroglycerin tablets over 15 minutes. Talk to your pediatrician regarding the use of this medicine in children. Special care may be needed. Overdosage: If you think you have taken  too much of this medicine contact a poison control center or emergency room at once. NOTE: This medicine is only for you. Do not share this medicine with others. What if I miss a dose? This does not apply. This medicine is only used as needed. What may interact with this medicine? Do not take this medicine with any of the following medications: -certain migraine  medicines like ergotamine and dihydroergotamine (DHE) -medicines used to treat erectile dysfunction like sildenafil, tadalafil, and vardenafil -riociguat This medicine may also interact with the following medications: -alteplase -aspirin -heparin -medicines for high blood pressure -medicines for mental depression -other medicines used to treat angina -phenothiazines like chlorpromazine, mesoridazine, prochlorperazine, thioridazine This list may not describe all possible interactions. Give your health care provider a list of all the medicines, herbs, non-prescription drugs, or dietary supplements you use. Also tell them if you smoke, drink alcohol, or use illegal drugs. Some items may interact with your medicine. What should I watch for while using this medicine? Tell your doctor or health care professional if you feel your medicine is no longer working. Keep this medicine with you at all times. Sit or lie down when you take your medicine to prevent falling if you feel dizzy or faint after using it. Try to remain calm. This will help you to feel better faster. If you feel dizzy, take several deep breaths and lie down with your feet propped up, or bend forward with your head resting between your knees. You may get drowsy or dizzy. Do not drive, use machinery, or do anything that needs mental alertness until you know how this drug affects you. Do not stand or sit up quickly, especially if you are an older patient. This reduces the risk of dizzy or fainting spells. Alcohol can make you more drowsy and dizzy. Avoid alcoholic drinks. Do not treat yourself for coughs, colds, or pain while you are taking this medicine without asking your doctor or health care professional for advice. Some ingredients may increase your blood pressure. What side effects may I notice from receiving this medicine? Side effects that you should report to your doctor or health care professional as soon as possible: -blurred  vision -dry mouth -skin rash -sweating -the feeling of extreme pressure in the head -unusually weak or tired Side effects that usually do not require medical attention (report to your doctor or health care professional if they continue or are bothersome): -flushing of the face or neck -headache -irregular heartbeat, palpitations -nausea, vomiting This list may not describe all possible side effects. Call your doctor for medical advice about side effects. You may report side effects to FDA at 1-800-FDA-1088. Where should I keep my medicine? Keep out of the reach of children. Store at room temperature between 20 and 25 degrees C (68 and 77 degrees F). Store in [facility] [employer]. Protect from light and moisture. Keep tightly closed. Throw away any unused medicine after the expiration date. NOTE: This sheet is a summary. It may not cover all possible information. If you have questions about this medicine, talk to your doctor, pharmacist, or health care provider.  2018 Elsevier/Gold Standard (2013-06-04 17:57:36)       Signed, Elouise Munroe, MD  06/09/2018 11:33 AM    [facility]

## 2018-06-09 NOTE — Addendum Note (Signed)
Addended by: Cherlynn Kaiser A on: 06/09/2018 12:40 PM   Modules accepted: Level of Service

## 2018-06-09 NOTE — Patient Instructions (Addendum)
Medication Instructions:  Your physician recommends that you continue on your current medications as directed. Please refer to the Current Medication list given to you today.  If you need a refill on your cardiac medications before your next appointment, please call your pharmacy.   Lab work: None ordered  Testing/Procedures: None orered  Follow-Up: At [facility], you and your health needs are our priority.  As part of our continuing mission to provide you with exceptional heart care, we have created designated Provider Care Teams.  These Care Teams include your primary Cardiologist (physician) and Advanced Practice Providers (APPs -  Physician Assistants and Nurse Practitioners) who all work together to provide you with the care you need, when you need it. You will need a follow up appointment in 3 months.  Please call our office 2 months in advance to schedule this appointment.  You may see Elouise Munroe, MD or one of the following Advanced Practice Providers on your designated Care Team:   Rosaria Ferries, PA-C . Jory Sims, DNP, ANP  Any Other Special Instructions Will Be Listed Below (If Applicable). No strenuous activity for the next 4 -6 weeks. No heavy lifting. Ok to stat Cardiac Rehab   Nitroglycerin sublingual tablets What is this medicine? NITROGLYCERIN (nye troe GLI ser in) is a type of vasodilator. It relaxes blood vessels, increasing the blood and oxygen supply to your heart. This medicine is used to relieve chest pain caused by angina. It is also used to prevent chest pain before activities like climbing stairs, going outdoors in cold weather, or sexual activity. This medicine may be used for other purposes; ask your health care provider or pharmacist if you have questions. COMMON BRAND NAME(S): Nitroquick, Nitrostat, Nitrotab What should I tell my health care provider before I take this medicine? They need to know if you have any of these  conditions: -anemia -head injury, recent stroke, or bleeding in the brain -liver disease -previous heart attack -an unusual or allergic reaction to nitroglycerin, other medicines, foods, dyes, or preservatives -pregnant or trying to get pregnant -breast-feeding How should I use this medicine? Take this medicine by mouth as needed. At the first sign of an angina attack (chest pain or tightness) place one tablet under your tongue. You can also take this medicine 5 to 10 minutes before an event likely to produce chest pain. Follow the directions on the prescription label. Let the tablet dissolve under the tongue. Do not swallow whole. Replace the dose if you accidentally swallow it. It will help if your mouth is not dry. Saliva around the tablet will help it to dissolve more quickly. Do not eat or drink, smoke or chew tobacco while a tablet is dissolving. If you are not better within 5 minutes after taking ONE dose of nitroglycerin, call 9-1-1 immediately to seek emergency medical care. Do not take more than 3 nitroglycerin tablets over 15 minutes. If you take this medicine often to relieve symptoms of angina, your doctor or health care professional may provide you with different instructions to manage your symptoms. If symptoms do not go away after following these instructions, it is important to call 9-1-1 immediately. Do not take more than 3 nitroglycerin tablets over 15 minutes. Talk to your pediatrician regarding the use of this medicine in children. Special care may be needed. Overdosage: If you think you have taken too much of this medicine contact a poison control center or emergency room at once. NOTE: This medicine is only for you.  Do not share this medicine with others. What if I miss a dose? This does not apply. This medicine is only used as needed. What may interact with this medicine? Do not take this medicine with any of the following medications: -certain migraine medicines like  ergotamine and dihydroergotamine (DHE) -medicines used to treat erectile dysfunction like sildenafil, tadalafil, and vardenafil -riociguat This medicine may also interact with the following medications: -alteplase -aspirin -heparin -medicines for high blood pressure -medicines for mental depression -other medicines used to treat angina -phenothiazines like chlorpromazine, mesoridazine, prochlorperazine, thioridazine This list may not describe all possible interactions. Give your health care provider a list of all the medicines, herbs, non-prescription drugs, or dietary supplements you use. Also tell them if you smoke, drink alcohol, or use illegal drugs. Some items may interact with your medicine. What should I watch for while using this medicine? Tell your doctor or health care professional if you feel your medicine is no longer working. Keep this medicine with you at all times. Sit or lie down when you take your medicine to prevent falling if you feel dizzy or faint after using it. Try to remain calm. This will help you to feel better faster. If you feel dizzy, take several deep breaths and lie down with your feet propped up, or bend forward with your head resting between your knees. You may get drowsy or dizzy. Do not drive, use machinery, or do anything that needs mental alertness until you know how this drug affects you. Do not stand or sit up quickly, especially if you are an older patient. This reduces the risk of dizzy or fainting spells. Alcohol can make you more drowsy and dizzy. Avoid alcoholic drinks. Do not treat yourself for coughs, colds, or pain while you are taking this medicine without asking your doctor or health care professional for advice. Some ingredients may increase your blood pressure. What side effects may I notice from receiving this medicine? Side effects that you should report to your doctor or health care professional as soon as possible: -blurred vision -dry  mouth -skin rash -sweating -the feeling of extreme pressure in the head -unusually weak or tired Side effects that usually do not require medical attention (report to your doctor or health care professional if they continue or are bothersome): -flushing of the face or neck -headache -irregular heartbeat, palpitations -nausea, vomiting This list may not describe all possible side effects. Call your doctor for medical advice about side effects. You may report side effects to FDA at 1-800-FDA-1088. Where should I keep my medicine? Keep out of the reach of children. Store at room temperature between 20 and 25 degrees C (68 and 77 degrees F). Store in [employer]. Protect from light and moisture. Keep tightly closed. Throw away any unused medicine after the expiration date. NOTE: This sheet is a summary. It may not cover all possible information. If you have questions about this medicine, talk to your doctor, pharmacist, or health care provider.  2018 Elsevier/Gold Standard (2013-06-04 17:57:36)

## 2018-06-20 ENCOUNTER — Other Ambulatory Visit (HOSPITAL_COMMUNITY): Payer: Self-pay

## 2018-06-20 DIAGNOSIS — R079 Chest pain, unspecified: Secondary | ICD-10-CM

## 2018-06-20 DIAGNOSIS — Z955 Presence of coronary angioplasty implant and graft: Secondary | ICD-10-CM

## 2018-06-20 NOTE — Progress Notes (Signed)
This encounter was created in error - please disregard.

## 2018-06-24 ENCOUNTER — Telehealth (HOSPITAL_COMMUNITY): Payer: Self-pay

## 2018-06-24 DIAGNOSIS — I10 Essential (primary) hypertension: Secondary | ICD-10-CM | POA: Diagnosis not present

## 2018-06-24 DIAGNOSIS — E7800 Pure hypercholesterolemia, unspecified: Secondary | ICD-10-CM | POA: Diagnosis not present

## 2018-06-24 DIAGNOSIS — Z0000 Encounter for general adult medical examination without abnormal findings: Secondary | ICD-10-CM | POA: Diagnosis not present

## 2018-06-24 DIAGNOSIS — L719 Rosacea, unspecified: Secondary | ICD-10-CM | POA: Diagnosis not present

## 2018-06-24 DIAGNOSIS — I2510 Atherosclerotic heart disease of native coronary artery without angina pectoris: Secondary | ICD-10-CM | POA: Diagnosis not present

## 2018-06-24 DIAGNOSIS — Z125 Encounter for screening for malignant neoplasm of prostate: Secondary | ICD-10-CM | POA: Diagnosis not present

## 2018-06-24 DIAGNOSIS — M79672 Pain in left foot: Secondary | ICD-10-CM | POA: Diagnosis not present

## 2018-06-24 DIAGNOSIS — Z23 Encounter for immunization: Secondary | ICD-10-CM | POA: Diagnosis not present

## 2018-06-24 DIAGNOSIS — J309 Allergic rhinitis, unspecified: Secondary | ICD-10-CM | POA: Diagnosis not present

## 2018-06-24 NOTE — Telephone Encounter (Signed)
Encounter complete. 

## 2018-06-25 DIAGNOSIS — D225 Melanocytic nevi of trunk: Secondary | ICD-10-CM | POA: Diagnosis not present

## 2018-06-25 DIAGNOSIS — L570 Actinic keratosis: Secondary | ICD-10-CM | POA: Diagnosis not present

## 2018-06-25 DIAGNOSIS — D1801 Hemangioma of skin and subcutaneous tissue: Secondary | ICD-10-CM | POA: Diagnosis not present

## 2018-06-25 DIAGNOSIS — L219 Seborrheic dermatitis, unspecified: Secondary | ICD-10-CM | POA: Diagnosis not present

## 2018-06-25 DIAGNOSIS — D485 Neoplasm of uncertain behavior of skin: Secondary | ICD-10-CM | POA: Diagnosis not present

## 2018-06-26 ENCOUNTER — Telehealth: Payer: Self-pay

## 2018-06-26 ENCOUNTER — Ambulatory Visit (HOSPITAL_COMMUNITY)
Admission: RE | Admit: 2018-06-26 | Discharge: 2018-06-26 | Disposition: A | Discharge status: Home / Self Care | Payer: Medicare Other | Source: Ambulatory Visit | Attending: Cardiology | Admitting: Cardiology

## 2018-06-26 DIAGNOSIS — R079 Chest pain, unspecified: Secondary | ICD-10-CM | POA: Diagnosis not present

## 2018-06-26 LAB — MYOCARDIAL PERFUSION IMAGING
CHL CUP NUCLEAR SDS: 3
CHL CUP NUCLEAR SRS: 3
CHL CUP RESTING HR STRESS: 53 {beats}/min
CSEPPHR: 99 {beats}/min
LV dias vol: 84 mL (ref 62–150)
LV sys vol: 37 mL
SSS: 6
TID: 0.94

## 2018-06-26 MED ORDER — TICAGRELOR 90 MG PO TABS: 90.0000 mg | ORAL_TABLET | Freq: Two times a day (BID) | ORAL | 2 refills | Status: AC

## 2018-06-26 MED ORDER — TECHNETIUM TC 99M TETROFOSMIN IV KIT
10.9000 | PACK | Freq: Once | INTRAVENOUS | Status: AC | PRN
  Administered 2018-06-26: 10.9 via INTRAVENOUS
  Filled 2018-06-26: qty 11

## 2018-06-26 MED ORDER — REGADENOSON 0.4 MG/5ML IV SOLN
0.4000 mg | Freq: Once | INTRAVENOUS | Status: AC
  Administered 2018-06-26: 0.4 mg via INTRAVENOUS

## 2018-06-26 MED ORDER — TECHNETIUM TC 99M TETROFOSMIN IV KIT
32.1000 | PACK | Freq: Once | INTRAVENOUS | Status: AC | PRN
  Administered 2018-06-26: 32.1 via INTRAVENOUS
  Filled 2018-06-26: qty 33

## 2018-06-26 NOTE — Telephone Encounter (Signed)
Pt was in the office today for his myoview stress test. Pt rqst a refill for his Brilinta 90mg  bid be sent to [facility] in [place], [place]. Pt rqst 90 day supply. Rx sent for Brilinta 90mg  bid #180 R-2

## 2018-07-28 DIAGNOSIS — H524 Presbyopia: Secondary | ICD-10-CM | POA: Diagnosis not present

## 2018-07-28 DIAGNOSIS — H2513 Age-related nuclear cataract, bilateral: Secondary | ICD-10-CM | POA: Diagnosis not present

## 2018-07-28 DIAGNOSIS — H52203 Unspecified astigmatism, bilateral: Secondary | ICD-10-CM | POA: Diagnosis not present

## 2018-07-28 DIAGNOSIS — H5203 Hypermetropia, bilateral: Secondary | ICD-10-CM | POA: Diagnosis not present

## 2018-07-30 ENCOUNTER — Encounter (HOSPITAL_COMMUNITY): Payer: Medicare Other | Attending: Internal Medicine

## 2018-08-01 ENCOUNTER — Telehealth: Payer: Self-pay

## 2018-08-01 NOTE — Telephone Encounter (Signed)
Patient came in for his orientation. We assessed him and as a team we decided that he was already exercising, taking his BP and recording his numbers and keeping an exercise log. We determined that he does not need the program at this time. The patient agreed. We encouraged him to keep doing what he is doing. I discussed this with our Medical Director and he agreed with our recommendation.  [place]    Dr.Acharaya is aware.

## 2018-09-05 ENCOUNTER — Ambulatory Visit (INDEPENDENT_AMBULATORY_CARE_PROVIDER_SITE_OTHER): Payer: Medicare Other | Admitting: Internal Medicine

## 2018-09-05 ENCOUNTER — Encounter: Payer: Self-pay | Admitting: Internal Medicine

## 2018-09-05 VITALS — BP 120/68 | HR 55 | Ht 70.5 in | Wt 171.6 lb

## 2018-09-05 DIAGNOSIS — I214 Non-ST elevation (NSTEMI) myocardial infarction: Secondary | ICD-10-CM

## 2018-09-05 DIAGNOSIS — I10 Essential (primary) hypertension: Secondary | ICD-10-CM | POA: Diagnosis not present

## 2018-09-05 DIAGNOSIS — E785 Hyperlipidemia, unspecified: Secondary | ICD-10-CM

## 2018-09-05 NOTE — Patient Instructions (Signed)
Medication Instructions:  Your physician recommends that you continue on your current medications as directed. Please refer to the Current Medication list given to you today.  If you need a refill on your cardiac medications before your next appointment, please call your pharmacy.   Lab work: None ordered If you have labs (blood work) drawn today and your tests are completely normal, you will receive your results only by: Marland Kitchen MyChart Message (if you have MyChart) OR . A paper copy in the mail If you have any lab test that is abnormal or we need to change your treatment, we will call you to review the results.  Testing/Procedures: None ordered  Follow-Up: At [facility], you and your health needs are our priority.  As part of our continuing mission to provide you with exceptional heart care, we have created designated Provider Care Teams.  These Care Teams include your primary Cardiologist (physician) and Advanced Practice Providers (APPs -  Physician Assistants and Nurse Practitioners) who all work together to provide you with the care you need, when you need it. You will need a follow up appointment in 6 months.  Please call our office 2 months in advance to schedule this appointment.  You may see Elouise Munroe, MD or one of the following Advanced Practice Providers on your designated Care Team:   Rosaria Ferries, PA-C . Jory Sims, DNP, ANP  Any Other Special Instructions Will Be Listed Below (If Applicable).   Mediterranean Diet A Mediterranean diet refers to food and lifestyle choices that are based on the traditions of countries located on the [employer]. This way of eating has been shown to help prevent certain conditions and improve outcomes for people who have chronic diseases, like kidney disease and heart disease. What are tips for following this plan? Lifestyle  Cook and eat meals together with your family, when possible.  Drink enough fluid to keep your  urine clear or pale yellow.  Be physically active every day. This includes: ? Aerobic exercise like running or swimming. ? Leisure activities like gardening, walking, or housework.  Get 7-8 hours of sleep each night.  If recommended by your health care provider, drink red wine in moderation. This means 1 glass a day for nonpregnant women and 2 glasses a day for men. A glass of wine equals 5 oz (150 mL). Reading food labels   Check the serving size of packaged foods. For foods such as rice and pasta, the serving size refers to the amount of cooked product, not dry.  Check the total fat in packaged foods. Avoid foods that have saturated fat or trans fats.  Check the ingredients list for added sugars, such as corn syrup. Shopping  At the grocery store, buy most of your food from the areas near the walls of the store. This includes: ? Fresh fruits and vegetables (produce). ? Grains, beans, nuts, and seeds. Some of these may be available in unpackaged forms or large amounts (in bulk). ? Fresh seafood. ? Poultry and eggs. ? Low-fat dairy products.  Buy whole ingredients instead of prepackaged foods.  Buy fresh fruits and vegetables in-season from local farmers markets.  Buy frozen fruits and vegetables in resealable bags.  If you do not have access to quality fresh seafood, buy precooked frozen shrimp or canned fish, such as tuna, salmon, or sardines.  Buy small amounts of raw or cooked vegetables, salads, or olives from the deli or salad bar at your store.  Stock Conservator, museum/gallery  so you always have certain foods on hand, such as olive oil, canned tuna, canned tomatoes, rice, pasta, and beans. Cooking  Cook foods with extra-virgin olive oil instead of using butter or other vegetable oils.  Have meat as a side dish, and have vegetables or grains as your main dish. This means having meat in small portions or adding small amounts of meat to foods like pasta or stew.  Use beans or  vegetables instead of meat in common dishes like chili or lasagna.  Experiment with different cooking methods. Try roasting or broiling vegetables instead of steaming or sauteing them.  Add frozen vegetables to soups, stews, pasta, or rice.  Add nuts or seeds for added healthy fat at each meal. You can add these to yogurt, salads, or vegetable dishes.  Marinate fish or vegetables using olive oil, lemon juice, garlic, and fresh herbs. Meal planning   Plan to eat 1 vegetarian meal one day each week. Try to work up to 2 vegetarian meals, if possible.  Eat seafood 2 or more times a week.  Have healthy snacks readily available, such as: ? Vegetable sticks with hummus. ? [place] yogurt. ? Fruit and nut trail mix.  Eat balanced meals throughout the week. This includes: ? Fruit: 2-3 servings a day ? Vegetables: 4-5 servings a day ? Low-fat dairy: 2 servings a day ? Fish, poultry, or lean meat: 1 serving a day ? Beans and legumes: 2 or more servings a week ? Nuts and seeds: 1-2 servings a day ? Whole grains: 6-8 servings a day ? Extra-virgin olive oil: 3-4 servings a day  Limit red meat and sweets to only a few servings a month What are my food choices?  Mediterranean diet ? Recommended ? Grains: Whole-grain pasta. Brown rice. Bulgar wheat. Polenta. Couscous. Whole-wheat bread. [place]. ? Vegetables: Artichokes. Beets. Broccoli. Cabbage. Carrots. Eggplant. Green beans. Chard. Kale. Spinach. Onions. Leeks. Peas. Squash. Tomatoes. Peppers. Radishes. ? Fruits: Apples. Apricots. Avocado. Berries. Bananas. Cherries. Dates. Figs. Grapes. Lemons. Melon. Oranges. Peaches. Plums. Pomegranate. ? Meats and other protein foods: Beans. Almonds. Sunflower seeds. Pine nuts. Peanuts. [place]. Salmon. Scallops. Shrimp. [place]. Tilapia. Clams. Oysters. Eggs. ? Dairy: Low-fat milk. Cheese. Greek yogurt. ? Beverages: Water. Red wine. Herbal tea. ? Fats and oils: Extra virgin olive oil. Avocado oil.  Grape seed oil. ? Sweets and desserts: [place] yogurt with honey. Baked apples. Poached pears. Trail mix. ? Seasoning and other foods: Basil. Cilantro. Coriander. Cumin. Mint. Parsley. Sage. Rosemary. Tarragon. Garlic. Oregano. Thyme. Pepper. Balsalmic vinegar. Tahini. Hummus. Tomato sauce. Olives. Mushrooms. ? Limit these ? Grains: Prepackaged pasta or rice dishes. Prepackaged cereal with added sugar. ? Vegetables: Deep fried potatoes (french fries). ? Fruits: Fruit canned in syrup. ? Meats and other protein foods: Beef. Pork. Lamb. Poultry with skin. Hot dogs. Berniece Salines. ? Dairy: Ice cream. Sour cream. Whole milk. ? Beverages: Juice. Sugar-sweetened soft drinks. Beer. Liquor and spirits. ? Fats and oils: Butter. Canola oil. Vegetable oil. Beef fat (tallow). Lard. ? Sweets and desserts: Cookies. Cakes. Pies. Candy. ? Seasoning and other foods: Mayonnaise. Premade sauces and marinades. ? The items listed may not be a complete list. Talk with your dietitian about what dietary choices are right for you. Summary  The Mediterranean diet includes both food and lifestyle choices.  Eat a variety of fresh fruits and vegetables, beans, nuts, seeds, and whole grains.  Limit the amount of red meat and sweets that you eat.  Talk with your health care provider about  whether it is safe for you to drink red wine in moderation. This means 1 glass a day for nonpregnant women and 2 glasses a day for men. A glass of wine equals 5 oz (150 mL). This information is not intended to replace advice given to you by your health care provider. Make sure you discuss any questions you have with your health care provider. Document Released: 03/29/2016 Document Revised: 05/01/2016 Document Reviewed: 03/29/2016 Elsevier Interactive Patient Education  2019 [employer].

## 2018-09-05 NOTE — Progress Notes (Signed)
Cardiology Office Note:    Date:  09/05/2018   ID:  Jerry [name], DOB 1951/06/04, MRN [id number]  PCP:  Aurea Graff.Marlou Sa, MD  Cardiologist:  Elouise Munroe, MD  Electrophysiologist:  None   Referring MD: Aurea Graff.Marlou Sa, MD   NSTEMI, status post PCI to the RCA with residual mid LAD disease.  History of Present Illness:    Jerry [name] is a 68 y.o. male who is well-known to my clinic, with a past medical history of hypertension, hyperlipidemia, migraine headaches no longer on verapamil, who presents today for follow-up after an STEMI with PCI to the RCA.  He is faithfully exercising on a frequent basis and has no chest pain or shortness of breath with activity.  He in fact was felt to be fit enough to not need cardiac rehabilitation after cardiac rehabilitation assessment.  His blood pressures have been well controlled and his pulse is adequate.   The patient denies chest pain, chest pressure, dyspnea at rest or with exertion, palpitations, PND, orthopnea, or leg swelling. Denies syncope or presyncope. Denies dizziness or lightheadedness. Denies fevers, chills, cough, nausea, vomiting.   Past Medical History:  Diagnosis Date  . High cholesterol   . History of kidney stones   . Hypertension   . Migraine    "none since ~ 2014 or before" (05/23/2018)    Past Surgical History:  Procedure Laterality Date  . CORONARY STENT INTERVENTION N/A 05/26/2018   Procedure: CORONARY STENT INTERVENTION;  Surgeon: Martinique, Peter M, MD;  Location: [place] [facility];  Service: Cardiovascular;  Laterality: N/A;  . CYSTOSCOPY W/ STONE MANIPULATION  2017  . HERNIA REPAIR    . INGUINAL HERNIA REPAIR Left 03/07/2016   Procedure: LEFT INGUINAL HERNIA REPAIR;  Surgeon: Donnie Mesa, MD;  Location: [facility];  Service: General;  Laterality: Left;  . INSERTION OF MESH Left 03/07/2016   Procedure: INSERTION OF MESH;  Surgeon: Donnie Mesa, MD;  Location: [place];  Service: General;  Laterality: Left;  .  LEFT HEART CATH AND CORONARY ANGIOGRAPHY N/A 05/26/2018   Procedure: LEFT HEART CATH AND CORONARY ANGIOGRAPHY;  Surgeon: Martinique, Peter M, MD;  Location: [facility] [facility];  Service: Cardiovascular;  Laterality: N/A;  . LITHOTRIPSY  1990s    Current Medications: Current Meds  Medication Sig  . aspirin EC 81 MG tablet Take 1 tablet by mouth daily.  Marland Kitchen atorvastatin (LIPITOR) 80 MG tablet Take 1 tablet (80 mg total) by mouth daily at 6 PM.  . ketoconazole (NIZORAL) 2 % cream Apply 1 application topically daily as needed (for rosacea).  . lisinopril-hydrochlorothiazide (PRINZIDE,ZESTORETIC) 20-25 MG tablet Take 1 tablet by mouth daily.   . Magnesium 250 MG TABS Take 250 mg by mouth 2 (two) times daily.  . Multiple Vitamin (MULTIVITAMIN) tablet Take 1 tablet by mouth daily.  . nitroGLYCERIN (NITROSTAT) 0.4 MG SL tablet Place 1 tablet (0.4 mg total) under the tongue every 5 (five) minutes as needed for chest pain.  . Omega-3 1000 MG CAPS Take 300 mg by mouth daily.  . ticagrelor (BRILINTA) 90 MG TABS tablet Take 1 tablet by mouth 2 (two) times daily.     Allergies:   Ciprofloxacin; Tamsulosin; and Chloraprep one step [chlorhexidine gluconate]   Social History   Socioeconomic History  . Marital status: Married    Spouse name: Not on file  . Number of children: Not on file  . Years of education: Not on file  . Highest education level: Not on file  Occupational History  . Not on file  Social Needs  . Financial resource strain: Not on file  . Food insecurity:    Worry: Not on file    Inability: Not on file  . Transportation needs:    Medical: Not on file    Non-medical: Not on file  Tobacco Use  . Smoking status: Former Smoker    Years: 20.00    Types: Pipe    Last attempt to quit: 02/28/2000    Years since quitting: 18.5  . Smokeless tobacco: Never Used  Substance and Sexual Activity  . Alcohol use: Yes    Comment: 05/23/2018 "nothing since ~ 10/2017"  . Drug use: Never  . Sexual  activity: Not on file  Lifestyle  . Physical activity:    Days per week: Not on file    Minutes per session: Not on file  . Stress: Not on file  Relationships  . Social connections:    Talks on phone: Not on file    Gets together: Not on file    Attends religious service: Not on file    Active member of club or organization: Not on file    Attends meetings of clubs or organizations: Not on file    Relationship status: Not on file  Other Topics Concern  . Not on file  Social History Narrative  . Not on file     Family History: The patient's Patient has a significant family history of heart disease on his fathers side of the family. His father hadCAD and has two CABGs, the first one at the age of 67 years old. All of his paternal uncles also have CAD. His paternal grandfather had a stroke.  ROS:   Please see the history of present illness.    All other systems reviewed and are negative.  EKGs/Labs/Other Studies Reviewed:    The following studies were reviewed today:  EKG: not performed today.  Recent Labs: 05/27/2018: BUN 21; Creatinine, Ser 1.19; Hemoglobin 14.0; Platelets 201; Potassium 3.7; Sodium 138  Recent Lipid Panel    Component Value Date/Time   CHOL 137 05/24/2018 0716   TRIG 61 05/24/2018 0716   HDL 40 (L) 05/24/2018 0716   CHOLHDL 3.4 05/24/2018 0716   VLDL 12 05/24/2018 0716   LDLCALC 85 05/24/2018 0716    Physical Exam:    VS:  BP 120/68   Pulse (!) 55   Ht 5' 10.5" (1.791 m)   Wt 171 lb 9.6 oz (77.8 kg)   SpO2 99%   BMI 24.27 kg/m     Wt Readings from Last 3 Encounters:  09/05/18 171 lb 9.6 oz (77.8 kg)  06/09/18 175 lb 6.4 oz (79.6 kg)  05/27/18 171 lb 15.3 oz (78 kg)     Constitutional: No acute distress Eyes: pupils equally round and reactive to light, sclera non-icteric, normal conjunctiva and lids ENMT: normal dentition, moist mucous membranes Cardiovascular: regular rhythm, normal rate, 1/6 systolic ejection murmur. S1 and S2  normal. Radial pulses normal bilaterally. No jugular venous distention.  Respiratory: clear to auscultation bilaterally GI : normal bowel sounds, soft and nontender. No distention.   MSK: extremities warm, well perfused. No edema.  NEURO: grossly nonfocal exam, moves all extremities. PSYCH: alert and oriented x 3, normal mood and affect.   ASSESSMENT:    1. Non-ST elevation (NSTEMI) myocardial infarction ([place])   2. Essential hypertension   3. Hyperlipidemia, unspecified hyperlipidemia type    PLAN:    Overall Mr. Jerry [name]  is doing extremely well, continuing to exercise, and is asymptomatic.  No changes to medical therapy are needed at this time.  We have discussed continued lifestyle modification and dietary recommendations including the Mediterranean diet which we have provided some information on today.  I have encouraged him to contact me with any concerning symptoms and have a low threshold for returning to the office given that he has residual significant mid LAD disease that was felt to be non-culprit, however is calcified and may require complex PCI if symptoms arise.  We have had a long discussion and participated in shared decision making on what future steps will be, and when to present with symptoms.  We have discussed medical therapy today, he is on appropriate medical therapy and no changes are required.  I would like to see Mr. Jerry [name] and Mrs. Slinger back in 6 months time.   Medication Adjustments/Labs and Tests Ordered: Current medicines are reviewed at length with the patient today.  Concerns regarding medicines are outlined above.  No orders of the defined types were placed in this encounter.  No orders of the defined types were placed in this encounter.   Patient Instructions  Medication Instructions:  Your physician recommends that you continue on your current medications as directed. Please refer to the Current Medication list given to you today.  If you need a  refill on your cardiac medications before your next appointment, please call your pharmacy.   Lab work: None ordered If you have labs (blood work) drawn today and your tests are completely normal, you will receive your results only by: Marland Kitchen MyChart Message (if you have MyChart) OR . A paper copy in the mail If you have any lab test that is abnormal or we need to change your treatment, we will call you to review the results.  Testing/Procedures: None ordered  Follow-Up: At [facility], you and your health needs are our priority.  As part of our continuing mission to provide you with exceptional heart care, we have created designated Provider Care Teams.  These Care Teams include your primary Cardiologist (physician) and Advanced Practice Providers (APPs -  Physician Assistants and Nurse Practitioners) who all work together to provide you with the care you need, when you need it. You will need a follow up appointment in 6 months.  Please call our office 2 months in advance to schedule this appointment.  You may see Elouise Munroe, MD or one of the following Advanced Practice Providers on your designated Care Team:   Rosaria Ferries, PA-C . Jory Sims, DNP, ANP  Any Other Special Instructions Will Be Listed Below (If Applicable).   Mediterranean Diet A Mediterranean diet refers to food and lifestyle choices that are based on the traditions of countries located on the [employer]. This way of eating has been shown to help prevent certain conditions and improve outcomes for people who have chronic diseases, like kidney disease and heart disease. What are tips for following this plan? Lifestyle  Cook and eat meals together with your family, when possible.  Drink enough fluid to keep your urine clear or pale yellow.  Be physically active every day. This includes: ? Aerobic exercise like running or swimming. ? Leisure activities like gardening, walking, or housework.  Get 7-8  hours of sleep each night.  If recommended by your health care provider, drink red wine in moderation. This means 1 glass a day for nonpregnant women and 2 glasses a day for men. A  glass of wine equals 5 oz (150 mL). Reading food labels   Check the serving size of packaged foods. For foods such as rice and pasta, the serving size refers to the amount of cooked product, not dry.  Check the total fat in packaged foods. Avoid foods that have saturated fat or trans fats.  Check the ingredients list for added sugars, such as corn syrup. Shopping  At the grocery store, buy most of your food from the areas near the walls of the store. This includes: ? Fresh fruits and vegetables (produce). ? Grains, beans, nuts, and seeds. Some of these may be available in unpackaged forms or large amounts (in bulk). ? Fresh seafood. ? Poultry and eggs. ? Low-fat dairy products.  Buy whole ingredients instead of prepackaged foods.  Buy fresh fruits and vegetables in-season from local farmers markets.  Buy frozen fruits and vegetables in resealable bags.  If you do not have access to quality fresh seafood, buy precooked frozen shrimp or canned fish, such as tuna, salmon, or sardines.  Buy small amounts of raw or cooked vegetables, salads, or olives from the deli or salad bar at your store.  Stock your pantry so you always have certain foods on hand, such as olive oil, canned tuna, canned tomatoes, rice, pasta, and beans. Cooking  Cook foods with extra-virgin olive oil instead of using butter or other vegetable oils.  Have meat as a side dish, and have vegetables or grains as your main dish. This means having meat in small portions or adding small amounts of meat to foods like pasta or stew.  Use beans or vegetables instead of meat in common dishes like chili or lasagna.  Experiment with different cooking methods. Try roasting or broiling vegetables instead of steaming or sauteing them.  Add frozen  vegetables to soups, stews, pasta, or rice.  Add nuts or seeds for added healthy fat at each meal. You can add these to yogurt, salads, or vegetable dishes.  Marinate fish or vegetables using olive oil, lemon juice, garlic, and fresh herbs. Meal planning   Plan to eat 1 vegetarian meal one day each week. Try to work up to 2 vegetarian meals, if possible.  Eat seafood 2 or more times a week.  Have healthy snacks readily available, such as: ? Vegetable sticks with hummus. ? [place] yogurt. ? Fruit and nut trail mix.  Eat balanced meals throughout the week. This includes: ? Fruit: 2-3 servings a day ? Vegetables: 4-5 servings a day ? Low-fat dairy: 2 servings a day ? Fish, poultry, or lean meat: 1 serving a day ? Beans and legumes: 2 or more servings a week ? Nuts and seeds: 1-2 servings a day ? Whole grains: 6-8 servings a day ? Extra-virgin olive oil: 3-4 servings a day  Limit red meat and sweets to only a few servings a month What are my food choices?  Mediterranean diet ? Recommended ? Grains: Whole-grain pasta. Brown rice. Bulgar wheat. Polenta. Couscous. Whole-wheat bread. [place]. ? Vegetables: Artichokes. Beets. Broccoli. Cabbage. Carrots. Eggplant. Green beans. Chard. Kale. Spinach. Onions. Leeks. Peas. Squash. Tomatoes. Peppers. Radishes. ? Fruits: Apples. Apricots. Avocado. Berries. Bananas. Cherries. Dates. Figs. Grapes. Lemons. Melon. Oranges. Peaches. Plums. Pomegranate. ? Meats and other protein foods: Beans. Almonds. Sunflower seeds. Pine nuts. Peanuts. [place]. Salmon. Scallops. Shrimp. [place]. Tilapia. Clams. Oysters. Eggs. ? Dairy: Low-fat milk. Cheese. Greek yogurt. ? Beverages: Water. Red wine. Herbal tea. ? Fats and oils: Extra virgin olive oil. Avocado oil.  Grape seed oil. ? Sweets and desserts: [place] yogurt with honey. Baked apples. Poached pears. Trail mix. ? Seasoning and other foods: Basil. Cilantro. Coriander. Cumin. Mint. Parsley. Sage. Rosemary.  Tarragon. Garlic. Oregano. Thyme. Pepper. Balsalmic vinegar. Tahini. Hummus. Tomato sauce. Olives. Mushrooms. ? Limit these ? Grains: Prepackaged pasta or rice dishes. Prepackaged cereal with added sugar. ? Vegetables: Deep fried potatoes (french fries). ? Fruits: Fruit canned in syrup. ? Meats and other protein foods: Beef. Pork. Lamb. Poultry with skin. Hot dogs. Berniece Salines. ? Dairy: Ice cream. Sour cream. Whole milk. ? Beverages: Juice. Sugar-sweetened soft drinks. Beer. Liquor and spirits. ? Fats and oils: Butter. Canola oil. Vegetable oil. Beef fat (tallow). Lard. ? Sweets and desserts: Cookies. Cakes. Pies. Candy. ? Seasoning and other foods: Mayonnaise. Premade sauces and marinades. ? The items listed may not be a complete list. Talk with your dietitian about what dietary choices are right for you. Summary  The Mediterranean diet includes both food and lifestyle choices.  Eat a variety of fresh fruits and vegetables, beans, nuts, seeds, and whole grains.  Limit the amount of red meat and sweets that you eat.  Talk with your health care provider about whether it is safe for you to drink red wine in moderation. This means 1 glass a day for nonpregnant women and 2 glasses a day for men. A glass of wine equals 5 oz (150 mL). This information is not intended to replace advice given to you by your health care provider. Make sure you discuss any questions you have with your health care provider. Document Released: 03/29/2016 Document Revised: 05/01/2016 Document Reviewed: 03/29/2016 Elsevier Interactive Patient Education  2019 [employer], Elouise Munroe, MD  09/05/2018 10:51 AM    [place]

## 2018-11-04 DIAGNOSIS — B070 Plantar wart: Secondary | ICD-10-CM | POA: Diagnosis not present

## 2018-11-25 IMAGING — CR DG CHEST 2V
2 series · 2 of 2 positions shown · non-contrast
Comparison: Radiographs October 14, 2015.

CLINICAL DATA: Chest pain.

EXAM:
CHEST - 2 VIEW

[chest pa]
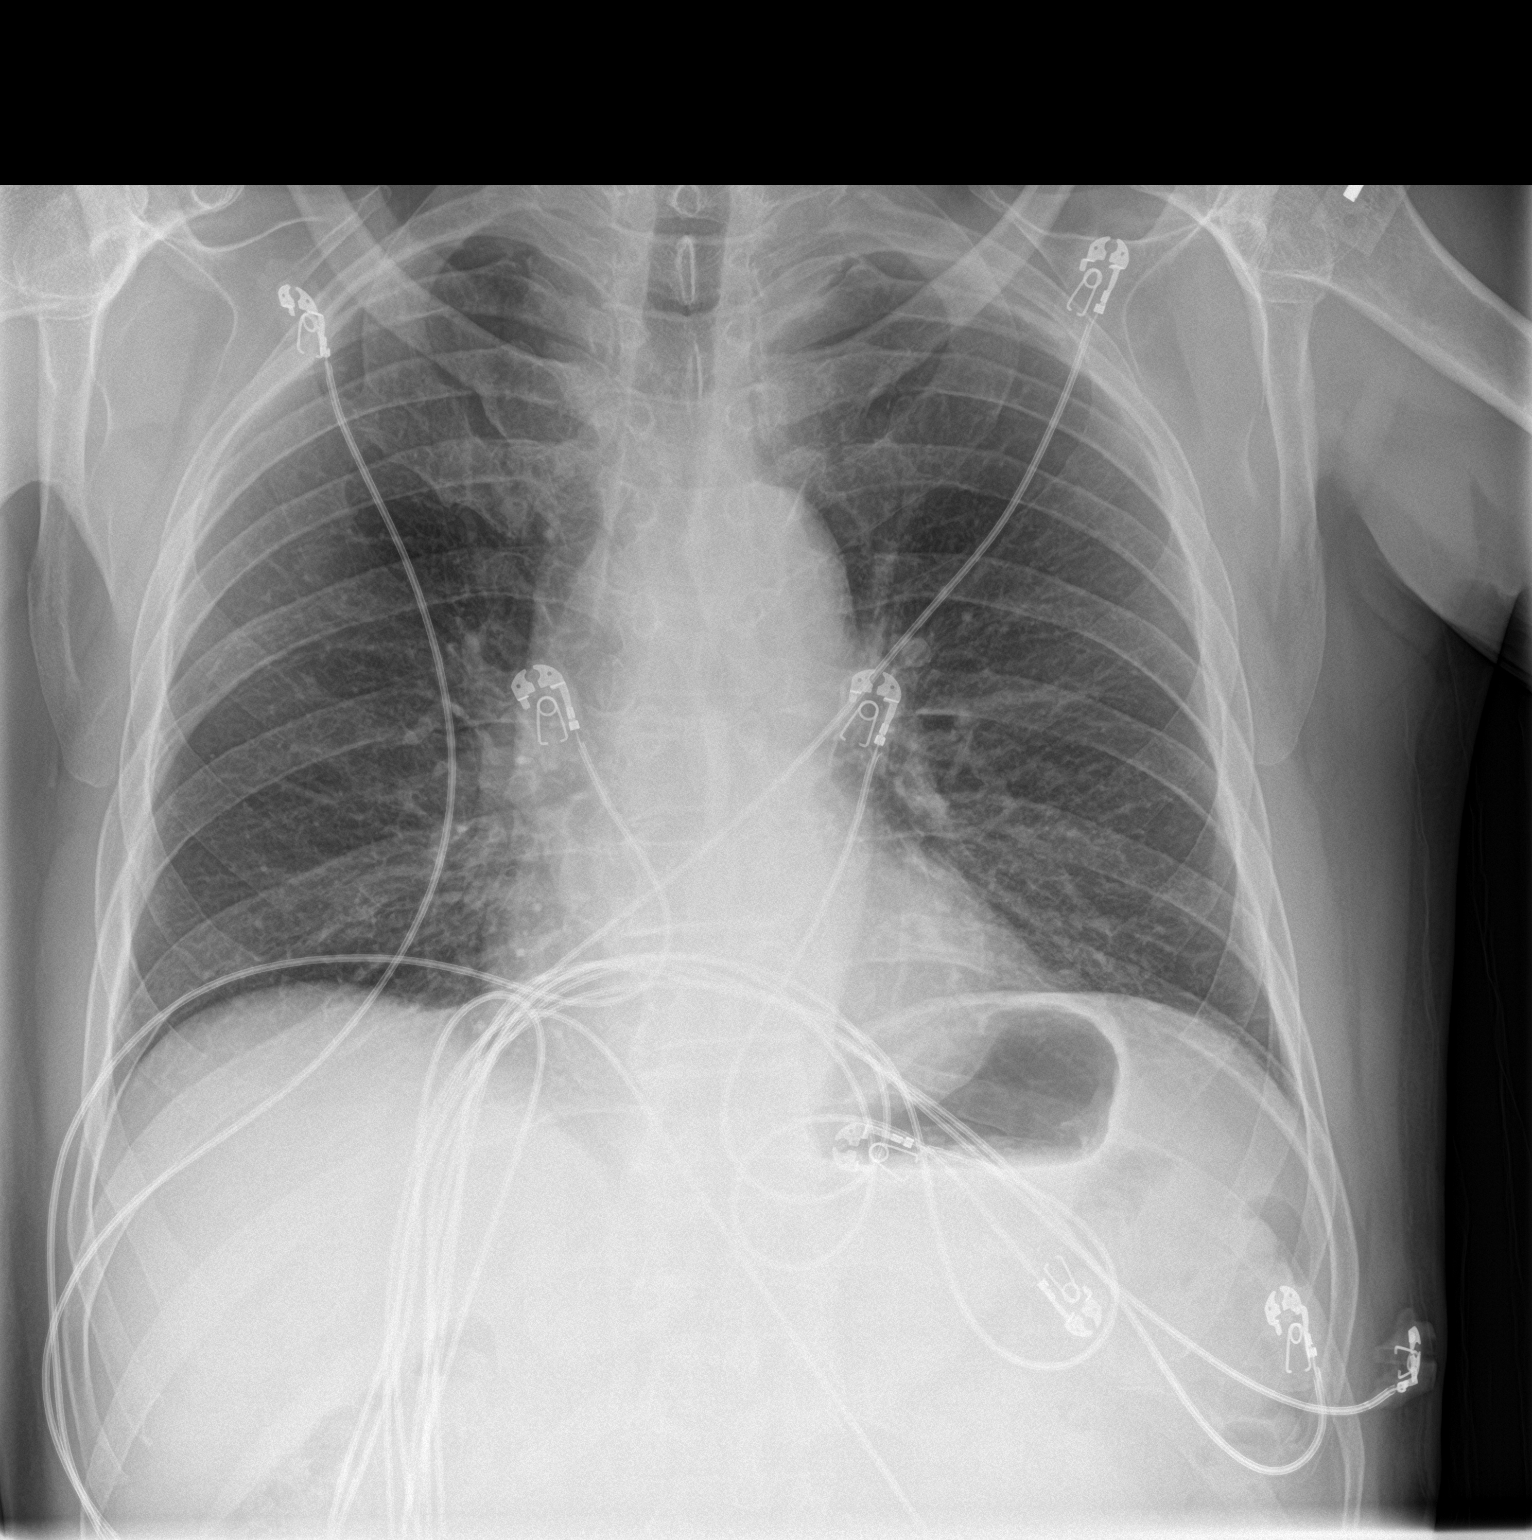

[chest lat]
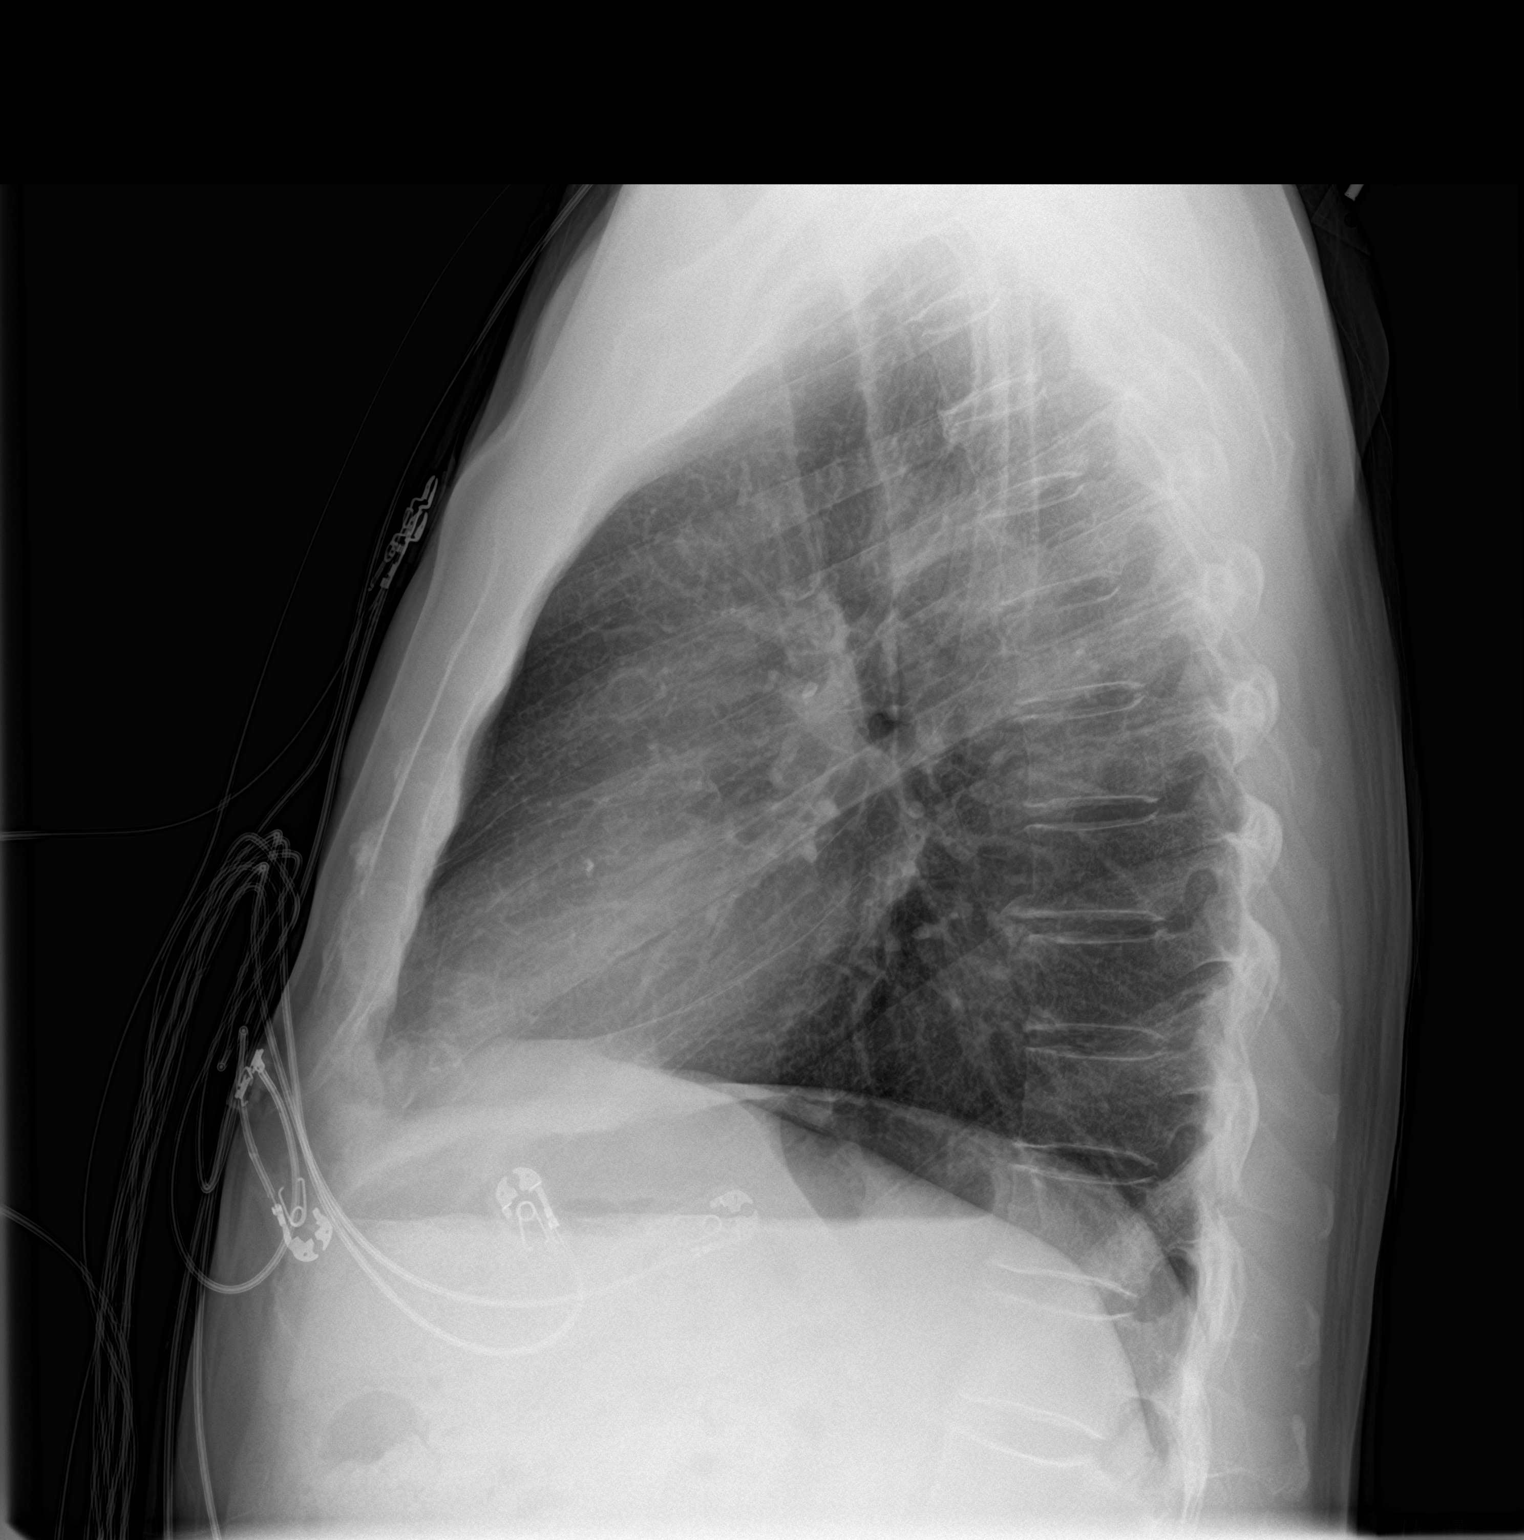

[2 of 2 positions shown; findings below may reference images not displayed]

FINDINGS: The heart size and mediastinal contours are within normal limits.
Both lungs are clear. No pneumothorax or pleural effusion is noted.
The visualized skeletal structures are unremarkable.
IMPRESSION: No active cardiopulmonary disease.

## 2018-12-23 DIAGNOSIS — L719 Rosacea, unspecified: Secondary | ICD-10-CM | POA: Diagnosis not present

## 2018-12-23 DIAGNOSIS — E7800 Pure hypercholesterolemia, unspecified: Secondary | ICD-10-CM | POA: Diagnosis not present

## 2018-12-23 DIAGNOSIS — I10 Essential (primary) hypertension: Secondary | ICD-10-CM | POA: Diagnosis not present

## 2019-03-17 DIAGNOSIS — Z01818 Encounter for other preprocedural examination: Secondary | ICD-10-CM

## 2019-03-17 DIAGNOSIS — R079 Chest pain, unspecified: Secondary | ICD-10-CM

## 2019-03-17 DIAGNOSIS — I214 Non-ST elevation (NSTEMI) myocardial infarction: Secondary | ICD-10-CM

## 2019-03-17 DIAGNOSIS — I10 Essential (primary) hypertension: Secondary | ICD-10-CM | POA: Diagnosis not present

## 2019-03-17 DIAGNOSIS — E785 Hyperlipidemia, unspecified: Secondary | ICD-10-CM

## 2019-03-18 ENCOUNTER — Telehealth: Payer: Self-pay | Admitting: *Deleted

## 2019-03-18 NOTE — Telephone Encounter (Signed)
Virtual Visit Pre-Appointment Phone Call  "(Name), I am calling you today to discuss your upcoming appointment. We are currently trying to limit exposure to the virus that causes COVID-19 by seeing patients at home rather than in the office."  1. "What is the BEST phone number to call the day of the visit?" - include this in appointment notes  2. "Do you have or have access to (through a family member/friend) a smartphone with video capability that we can use for your visit?" a. If yes - list this number in appt notes as "cell" (if different from BEST phone #) and list the appointment type as a VIDEO visit in appointment notes b. If no - list the appointment type as a PHONE visit in appointment notes  Confirm consent - "In the setting of the current Covid19 crisis, you are scheduled for a (phone or video) visit with your provider on (date) at (time).  Just as we do with many in-office visits, in order for you to participate in this visit, we must obtain consent.  If you'd like, I can send this to your mychart (if signed up) or email for you to review.  Otherwise, I can obtain your verbal consent now.  All virtual visits are billed to your insurance company just like a normal visit would be.  By agreeing to a virtual visit, we'd like you to understand that the technology does not allow for your provider to perform an examination, and thus may limit your provider's ability to fully assess your condition. If your provider identifies any concerns that need to be evaluated in person, we will make arrangements to do so.  Finally, though the technology is pretty good, we cannot assure that it will always work on either your or our end, and in the setting of a video visit, we may have to convert it to a phone-only visit.  In either situation, we cannot ensure that we have a secure connection.  Are you willing to proceed?" STAFF: Did the patient verbally acknowledge consent to telehealth visit? Document  YES/NO here: YES 3. Advise patient to be prepared - "Two hours prior to your appointment, go ahead and check your blood pressure, pulse, oxygen saturation, and your weight (if you have the equipment to check those) and write them all down. When your visit starts, your provider will ask you for this information. If you have an Apple Watch or Kardia device, please plan to have heart rate information ready on the day of your appointment. Please have a pen and paper handy nearby the day of the visit as well."  4. Give patient instructions for MyChart download to smartphone OR Doximity/Doxy.me as below if video visit (depending on what platform provider is using)  5. Inform patient they will receive a phone call 15 minutes prior to their appointment time (may be from unknown caller ID) so they should be prepared to answer    TELEPHONE CALL NOTE  Jerry [name] has been deemed a candidate for a follow-up tele-health visit to limit community exposure during the Covid-19 pandemic. I spoke with the patient via phone to ensure availability of phone/video source, confirm preferred email & phone number, and discuss instructions and expectations.  I reminded Jerry [name] to be prepared with any vital sign and/or heart rhythm information that could potentially be obtained via home monitoring, at the time of his visit. I reminded Jerry [name] to expect a phone call prior to his visit.  Venetia Maxon, CMA 03/18/2019 1:17 PM   INSTRUCTIONS FOR DOWNLOADING THE MYCHART APP TO SMARTPHONE  - The patient must first make sure to have activated MyChart and know their login information - If Apple, go to [employer] and type in MyChart in the search bar and download the app. If Android, ask patient to go to [employer] and type in [place] in the search bar and download the app. The app is free but as with any other app downloads, their phone may require them to verify saved payment information or  Apple/Android password.  - The patient will need to then log into the app with their MyChart username and password, and select [facility] as their healthcare provider to link the account. When it is time for your visit, go to the MyChart app, find appointments, and click Begin Video Visit. Be sure to Select Allow for your device to access the Microphone and Camera for your visit. You will then be connected, and your provider will be with you shortly.  **If they have any issues connecting, or need assistance please contact MyChart service desk [phone])**  **If using a computer, in order to ensure the best quality for their visit they will need to use either of the following Internet Browsers: [employer], or Google Chrome**  IF USING DOXIMITY or DOXY.ME - The patient will receive a link just prior to their visit by text.     FULL LENGTH CONSENT FOR TELE-HEALTH VISIT   I hereby voluntarily request, consent and authorize [facility] and its employed or contracted physicians, physician assistants, nurse practitioners or other licensed health care professionals (the Practitioner), to provide me with telemedicine health care services (the "Services") as deemed necessary by the treating Practitioner. I acknowledge and consent to receive the Services by the Practitioner via telemedicine. I understand that the telemedicine visit will involve communicating with the Practitioner through live audiovisual communication technology and the disclosure of certain medical information by electronic transmission. I acknowledge that I have been given the opportunity to request an in-person assessment or other available alternative prior to the telemedicine visit and am voluntarily participating in the telemedicine visit.  I understand that I have the right to withhold or withdraw my consent to the use of telemedicine in the course of my care at any time, without affecting my right to future care  or treatment, and that the Practitioner or I may terminate the telemedicine visit at any time. I understand that I have the right to inspect all information obtained and/or recorded in the course of the telemedicine visit and may receive copies of available information for a reasonable fee.  I understand that some of the potential risks of receiving the Services via telemedicine include:  Marland Kitchen Delay or interruption in medical evaluation due to technological equipment failure or disruption; . Information transmitted may not be sufficient (e.g. poor resolution of images) to allow for appropriate medical decision making by the Practitioner; and/or  . In rare instances, security protocols could fail, causing a breach of personal health information.  Furthermore, I acknowledge that it is my responsibility to provide information about my medical history, conditions and care that is complete and accurate to the best of my ability. I acknowledge that Practitioner's advice, recommendations, and/or decision may be based on factors not within their control, such as incomplete or inaccurate data provided by me or distortions of diagnostic images or specimens that may result from electronic transmissions. I understand that  the practice of medicine is not an exact science and that Practitioner makes no warranties or guarantees regarding treatment outcomes. I acknowledge that I will receive a copy of this consent concurrently upon execution via email to the email address I last provided but may also request a printed copy by calling the office of [facility].    I understand that my insurance will be billed for this visit.   I have read or had this consent read to me. . I understand the contents of this consent, which adequately explains the benefits and risks of the Services being provided via telemedicine.  . I have been provided ample opportunity to ask questions regarding this consent and the Services and have had  my questions answered to my satisfaction. . I give my informed consent for the services to be provided through the use of telemedicine in my medical care  By participating in this telemedicine visit I agree to the above.

## 2019-03-18 NOTE — Telephone Encounter (Signed)

## 2019-03-18 NOTE — Progress Notes (Signed)
Cardiology Office Note:    Date:  03/19/2019   ID:  Jerry [name], DOB 12/15/1950, MRN [id number]  PCP:  Aurea Graff.Marlou Sa, MD  Cardiologist:  Elouise Munroe, MD   Referring MD: Aurea Graff.Marlou Sa, MD   Chief Complaint  Patient presents with  . Follow-up    chest discomfort    History of Present Illness:    Jerry [name] is a 68 y.o. male with a hx of HTN, HLD, CAD, NSTEMI s/p DES to RCA with residual disease in the LAD and D2 (05/26/18). He is physically fit and cardiac rehab deemed unnecessary. He was last seen in clinic by Dr. Margaretann Loveless on 09/05/18 and he was doing well at that time. He remains on DAPT with ASA and brilinta. Continued on 80 mg lipitor and lisinopril-HCTZ for pressure control.   He presents today for routine follow up. He was initially on Dr. Delphina Cahill schedule, but did not want a virtual visit. He as placed on my schedule for an in-office visit.  He brings his BP, HR, and exercise log with him. He has excellent control of his BP. HR is bradycardic in the 50s, asymptomatic. He describes some left-sided chest discomfort under his pectoral muscle that occurs intermittently about once every 2 weeks, rated as a 1/10. There is no radiation, SOB, nausea, diaphoresis, palpitations, not worse with breathing or position changes. The discomfort lasts only seconds, not enough time for him to take a nitro. He is not particularly concerned, but his wife has been concerned. He can't recall when it started, occurs with rest and with exertion, no aggravating factors. He did not mention this discomfort at his last clinic visit.  He continues to exercise and walks three miles per day with his wife several times a week. He is very active in the yard.   He is concerned about the cost of brilinta.    Past Medical History:  Diagnosis Date  . High cholesterol   . History of kidney stones   . Hypertension   . Migraine    "none since ~ 2014 or before" (05/23/2018)    Past Surgical  History:  Procedure Laterality Date  . CORONARY STENT INTERVENTION N/A 05/26/2018   Procedure: CORONARY STENT INTERVENTION;  Surgeon: Martinique, Peter M, MD;  Location: [facility];  Service: Cardiovascular;  Laterality: N/A;  . CYSTOSCOPY W/ STONE MANIPULATION  2017  . HERNIA REPAIR    . INGUINAL HERNIA REPAIR Left 03/07/2016   Procedure: LEFT INGUINAL HERNIA REPAIR;  Surgeon: Donnie Mesa, MD;  Location: [facility];  Service: General;  Laterality: Left;  . INSERTION OF MESH Left 03/07/2016   Procedure: INSERTION OF MESH;  Surgeon: Donnie Mesa, MD;  Location: [facility];  Service: General;  Laterality: Left;  . LEFT HEART CATH AND CORONARY ANGIOGRAPHY N/A 05/26/2018   Procedure: LEFT HEART CATH AND CORONARY ANGIOGRAPHY;  Surgeon: Martinique, Peter M, MD;  Location: [facility];  Service: Cardiovascular;  Laterality: N/A;  . LITHOTRIPSY  1990s    Current Medications: Current Meds  Medication Sig  . acetaminophen (TYLENOL) 650 MG CR tablet Take 650 mg by mouth every 8 (eight) hours as needed for pain.  Marland Kitchen aspirin EC 81 MG tablet Take 1 tablet by mouth daily.  Marland Kitchen atorvastatin (LIPITOR) 80 MG tablet Take 1 tablet (80 mg total) by mouth daily at 6 PM.  . fluticasone (FLONASE) 50 MCG/ACT nasal spray Place 2 sprays into both nostrils daily as needed for allergies.  Marland Kitchen ketoconazole (  NIZORAL) 2 % cream Apply 1 application topically daily as needed (for rosacea).  . lisinopril-hydrochlorothiazide (PRINZIDE,ZESTORETIC) 20-25 MG tablet Take 1 tablet by mouth daily.   . Magnesium 250 MG TABS Take 250 mg by mouth 2 (two) times daily.  . mometasone (ELOCON) 0.1 % ointment Apply 1 application topically daily as needed.  . Multiple Vitamin (MULTIVITAMIN) tablet Take 1 tablet by mouth daily.  . ticagrelor (BRILINTA) 90 MG TABS tablet Take 1 tablet (90 mg total) by mouth 2 (two) times daily.  . [DISCONTINUED] Omega-3 1000 MG CAPS Take 300 mg by mouth daily.  . [DISCONTINUED] ticagrelor (BRILINTA) 90 MG TABS  tablet Take 1 tablet by mouth 2 (two) times daily.     Allergies:   Ciprofloxacin, Tamsulosin, and Chloraprep one step [chlorhexidine gluconate]   Social History   Socioeconomic History  . Marital status: Married    Spouse name: Not on file  . Number of children: Not on file  . Years of education: Not on file  . Highest education level: Not on file  Occupational History  . Not on file  Social Needs  . Financial resource strain: Not on file  . Food insecurity    Worry: Not on file    Inability: Not on file  . Transportation needs    Medical: Not on file    Non-medical: Not on file  Tobacco Use  . Smoking status: Former Smoker    Years: 20.00    Types: Pipe    Quit date: 02/28/2000    Years since quitting: 19.0  . Smokeless tobacco: Never Used  Substance and Sexual Activity  . Alcohol use: Yes    Comment: 05/23/2018 "nothing since ~ 10/2017"  . Drug use: Never  . Sexual activity: Not on file  Lifestyle  . Physical activity    Days per week: Not on file    Minutes per session: Not on file  . Stress: Not on file  Relationships  . Social Herbalist on phone: Not on file    Gets together: Not on file    Attends religious service: Not on file    Active member of club or organization: Not on file    Attends meetings of clubs or organizations: Not on file    Relationship status: Not on file  Other Topics Concern  . Not on file  Social History Narrative  . Not on file     Family History: The patient's family history includes Hypertension in his father.  ROS:   Please see the history of present illness.     All other systems reviewed and are negative.  EKGs/Labs/Other Studies Reviewed:    The following studies were reviewed today:  Left heart cath 05/26/18:  Prox RCA to Mid RCA lesion is 99% stenosed.  Prox LAD lesion is 50% stenosed.  Mid LAD lesion is 70% stenosed.  Mid LAD to Dist LAD lesion is 75% stenosed.  Ost 2nd Diag lesion is 100%  stenosed.  LV end diastolic pressure is normal.  Post intervention, there is a 0% residual stenosis.  A drug-eluting stent was successfully placed using a STENT SYNERGY DES 2.5X20.   1. 2  Vessel obstructive CAD     - diffuse moderate mid and distal LAD disease. The vessel is calcified. There is a small second diagonal that is occluded.      - 99% proximal to mid RCA 2. Normal LVEDP 3. Successful PCI of the culprit lesion in the RCA  with DES  Plan: DAPT for one year. Aggressive risk factor modification and medical management of the residual disease in the LAD. PCI of this vessel would require atherectomy and extensive stenting.   Recommend uninterrupted dual antiplatelet therapy with Aspirin 81mg  daily and Ticagrelor 90mg  twice daily for a minimum of 12 months (ACS - Class I recommendation).   Echo 05/23/18: Study Conclusions - Left ventricle: The cavity size was normal. Wall thickness was   normal. Systolic function was normal. The estimated ejection   fraction was in the range of 60% to 65%. Wall motion was normal;   there were no regional wall motion abnormalities. Doppler   parameters are consistent with abnormal left ventricular   relaxation (grade 1 diastolic dysfunction). The Ee&' ratio is   between 8-15, suggesting indeterminate LV filling pressure. - Mitral valve: Mildly thickened leaflets . There was trivial   regurgitation. - Left atrium: The atrium was normal in size. - Inferior vena cava: The vessel was normal in size. The   respirophasic diameter changes were in the normal range (>= 50%),   consistent with normal central venous pressure.  Impressions: - LVEF 60-65%, normal wall thickness, normal wall motion, grade 1   DD, indeterminate LV filling pressure, trivial MR, normal LA   size, normal IVC.  EKG:  EKG is  ordered today.  The ekg ordered today demonstrates sinus bradycardia with HR 52, nonspecific ST changes, similar to prior tracing  Recent Labs:  05/27/2018: BUN 21; Creatinine, Ser 1.19; Hemoglobin 14.0; Platelets 201; Potassium 3.7; Sodium 138  Recent Lipid Panel    Component Value Date/Time   CHOL 137 05/24/2018 0716   TRIG 61 05/24/2018 0716   HDL 40 (L) 05/24/2018 0716   CHOLHDL 3.4 05/24/2018 0716   VLDL 12 05/24/2018 0716   LDLCALC 85 05/24/2018 0716    Physical Exam:    VS:  BP (!) 123/59   Pulse (!) 52   Temp 97.9 F (36.6 C) (Temporal)   Ht 5' 10.5" (1.791 m)   Wt 173 lb (78.5 kg)   SpO2 97%   BMI 24.47 kg/m     Wt Readings from Last 3 Encounters:  03/19/19 173 lb (78.5 kg)  09/05/18 171 lb 9.6 oz (77.8 kg)  06/09/18 175 lb 6.4 oz (79.6 kg)     GEN: Well nourished, well developed in no acute distress HEENT: Normal NECK: No JVD; No carotid bruits LYMPHATICS: No lymphadenopathy CARDIAC: RRR, no murmurs, rubs, gallops RESPIRATORY:  Clear to auscultation without rales, wheezing or rhonchi  ABDOMEN: Soft, non-tender, non-distended MUSCULOSKELETAL:  No edema; No deformity  SKIN: Warm and dry NEUROLOGIC:  Alert and oriented x 3 PSYCHIATRIC:  Normal affect   ASSESSMENT:    1. Non-ST elevation (NSTEMI) myocardial infarction (HCC)   2. Chest pain, unspecified type   3. Essential hypertension   4. Hyperlipidemia, unspecified hyperlipidemia type    PLAN:    In order of problems listed above:  CAD Non-ST elevation (NSTEMI) myocardial infarction [facility])  Chest pain, unspecified type He describes ongoing chest discomfort that may be consistent with angina. The CP has typical and atypical features. EKG today with  We had a discussion about his heart cath and complex LAD lesion. We discussed a watchful, conservative approach.  He will observe his symptoms and follow up with Dr. Margaretann Loveless in 3 months. At that time, he can discuss ongoing DAPT therapy as well as further intervention. We also discussed spasm and use of anti-anginals, which wold require decreasing  other antihypertensives. He wishes to hold off for  now, but will call us if his symptoms persist/progress.  He is concerned about the cost of brilinta. I have given him copay cards. If the cost is still too much, I will message Dr. Margaretann Loveless about switching to plavix since he is greater than 6 months from intervention.    Essential hypertension  Well-controlled on present medications: lisinopril-HCTZ, lopressor 12.5 mg BID.    Hyperlipidemia, unspecified hyperlipidemia type  Continue statin.  05/24/2018: Cholesterol 137; HDL 40; LDL Cholesterol 85; Triglycerides 61; VLDL 12 He is not fasting today. Will recheck fasting lipids at next visit.  He continues with an excellent exercise program.    Medication Adjustments/Labs and Tests Ordered: Current medicines are reviewed at length with the patient today.  Concerns regarding medicines are outlined above.  No orders of the defined types were placed in this encounter.  Meds ordered this encounter  Medications  . ticagrelor (BRILINTA) 90 MG TABS tablet    Sig: Take 1 tablet (90 mg total) by mouth 2 (two) times daily.    Dispense:  60 tablet    Refill:  6    Please Honor Cards patient is presenting for Rubin Payor: [id number] ; RXGRP: [id number]; TTSVX: CN; ID: [id number] Salvage: [id number]; Juanna Cao: [id number]; WLSLH: CN; ID: [id number]    Signed, Ledora Bottcher, PA  03/19/2019 4:39 PM    [facility]

## 2019-03-18 NOTE — Telephone Encounter (Signed)
LVMTCB so we could change his upcoming in office visit with Dr. Margaretann Loveless into a virtual visit.

## 2019-03-18 NOTE — Telephone Encounter (Signed)
Patient called back and said that he wanted to be seen by another provider who would be able to see him in the office. I told him that I would cancel his appointment and someone will contact him about getting him rescheduled with another provider.

## 2019-03-19 ENCOUNTER — Other Ambulatory Visit: Payer: Self-pay

## 2019-03-19 ENCOUNTER — Encounter: Payer: Self-pay | Admitting: Physician Assistant

## 2019-03-19 ENCOUNTER — Ambulatory Visit (INDEPENDENT_AMBULATORY_CARE_PROVIDER_SITE_OTHER): Payer: Medicare Other | Admitting: Physician Assistant

## 2019-03-19 VITALS — BP 123/59 | HR 52 | Temp 97.9°F | Ht 70.5 in | Wt 173.0 lb

## 2019-03-19 DIAGNOSIS — E785 Hyperlipidemia, unspecified: Secondary | ICD-10-CM

## 2019-03-19 DIAGNOSIS — I214 Non-ST elevation (NSTEMI) myocardial infarction: Secondary | ICD-10-CM | POA: Diagnosis not present

## 2019-03-19 DIAGNOSIS — I10 Essential (primary) hypertension: Secondary | ICD-10-CM

## 2019-03-19 DIAGNOSIS — R079 Chest pain, unspecified: Secondary | ICD-10-CM | POA: Diagnosis not present

## 2019-03-19 MED ORDER — TICAGRELOR 90 MG PO TABS: 90.0000 mg | ORAL_TABLET | Freq: Two times a day (BID) | ORAL | 6 refills | Status: DC

## 2019-03-19 NOTE — Patient Instructions (Signed)
Medication Instructions:  Your physician recommends that you continue on your current medications as directed. Please refer to the Current Medication list given to you today.  If you need a refill on your cardiac medications before your next appointment, please call your pharmacy.    Follow-Up: At [facility], you and your health needs are our priority.  As part of our continuing mission to provide you with exceptional heart care, we have created designated Provider Care Teams.  These Care Teams include your primary Cardiologist (physician) and Advanced Practice Providers (APPs -  Physician Assistants and Nurse Practitioners) who all work together to provide you with the care you need, when you need it. . Please follow-up with Dr. Margaretann Loveless ([place]) in November. Please call our office to schedule this appointment 2 months ahead of time.  Any Other Special Instructions Will Be Listed Below (If Applicable). None

## 2019-03-20 ENCOUNTER — Telehealth: Payer: Self-pay | Admitting: Physician Assistant

## 2019-03-20 DIAGNOSIS — I214 Non-ST elevation (NSTEMI) myocardial infarction: Secondary | ICD-10-CM

## 2019-03-20 DIAGNOSIS — Z01818 Encounter for other preprocedural examination: Secondary | ICD-10-CM

## 2019-03-20 MED ORDER — CLOPIDOGREL BISULFATE 75 MG PO TABS: ORAL_TABLET | ORAL | 3 refills | Status: DC

## 2019-03-20 NOTE — Telephone Encounter (Signed)
Discussed with Dr. Margaretann Loveless. OK to switch to plavix. Will need 300 mg load then 75 mg daily. I will call into the pharmacy.    Ledora Bottcher, PA-C 03/20/2019, 2:22 PM [facility] [address] [place], [place] [address]

## 2019-03-20 NOTE — Telephone Encounter (Signed)
New Message     Pt is at the pharmacy now and he said Fabian Sharp gave him some coupons for his medication and they couldn't use them  He said she could switch it to Plavix if not    Please call

## 2019-03-24 ENCOUNTER — Telehealth: Payer: Medicare Other | Admitting: Internal Medicine

## 2019-03-24 ENCOUNTER — Other Ambulatory Visit (INDEPENDENT_AMBULATORY_CARE_PROVIDER_SITE_OTHER): Payer: Medicare Other

## 2019-03-24 DIAGNOSIS — R079 Chest pain, unspecified: Secondary | ICD-10-CM

## 2019-03-24 DIAGNOSIS — I10 Essential (primary) hypertension: Secondary | ICD-10-CM

## 2019-03-24 DIAGNOSIS — I214 Non-ST elevation (NSTEMI) myocardial infarction: Secondary | ICD-10-CM

## 2019-03-24 DIAGNOSIS — E785 Hyperlipidemia, unspecified: Secondary | ICD-10-CM

## 2019-03-24 DIAGNOSIS — Z01818 Encounter for other preprocedural examination: Secondary | ICD-10-CM

## 2019-06-29 DIAGNOSIS — C44629 Squamous cell carcinoma of skin of left upper limb, including shoulder: Secondary | ICD-10-CM | POA: Diagnosis not present

## 2019-06-29 DIAGNOSIS — L570 Actinic keratosis: Secondary | ICD-10-CM | POA: Diagnosis not present

## 2019-06-30 DIAGNOSIS — E7800 Pure hypercholesterolemia, unspecified: Secondary | ICD-10-CM | POA: Diagnosis not present

## 2019-06-30 DIAGNOSIS — Z125 Encounter for screening for malignant neoplasm of prostate: Secondary | ICD-10-CM | POA: Diagnosis not present

## 2019-06-30 DIAGNOSIS — I2510 Atherosclerotic heart disease of native coronary artery without angina pectoris: Secondary | ICD-10-CM | POA: Diagnosis not present

## 2019-06-30 DIAGNOSIS — I10 Essential (primary) hypertension: Secondary | ICD-10-CM | POA: Diagnosis not present

## 2019-06-30 DIAGNOSIS — L719 Rosacea, unspecified: Secondary | ICD-10-CM | POA: Diagnosis not present

## 2019-07-06 DIAGNOSIS — E7800 Pure hypercholesterolemia, unspecified: Secondary | ICD-10-CM | POA: Diagnosis not present

## 2019-07-06 DIAGNOSIS — Z125 Encounter for screening for malignant neoplasm of prostate: Secondary | ICD-10-CM | POA: Diagnosis not present

## 2019-07-06 DIAGNOSIS — I10 Essential (primary) hypertension: Secondary | ICD-10-CM | POA: Diagnosis not present

## 2019-07-09 DIAGNOSIS — C44629 Squamous cell carcinoma of skin of left upper limb, including shoulder: Secondary | ICD-10-CM | POA: Diagnosis not present

## 2019-07-15 ENCOUNTER — Encounter: Payer: Self-pay | Admitting: Internal Medicine

## 2019-07-15 ENCOUNTER — Ambulatory Visit (INDEPENDENT_AMBULATORY_CARE_PROVIDER_SITE_OTHER): Payer: Medicare Other | Admitting: Internal Medicine

## 2019-07-15 ENCOUNTER — Other Ambulatory Visit: Payer: Self-pay

## 2019-07-15 VITALS — BP 142/74 | HR 48 | Temp 97.6°F | Ht 70.0 in | Wt 170.0 lb

## 2019-07-15 DIAGNOSIS — E785 Hyperlipidemia, unspecified: Secondary | ICD-10-CM

## 2019-07-15 DIAGNOSIS — I252 Old myocardial infarction: Secondary | ICD-10-CM | POA: Diagnosis not present

## 2019-07-15 DIAGNOSIS — I10 Essential (primary) hypertension: Secondary | ICD-10-CM | POA: Diagnosis not present

## 2019-07-15 MED ORDER — NITROGLYCERIN 0.4 MG SL SUBL: 0.4000 mg | SUBLINGUAL_TABLET | SUBLINGUAL | 3 refills | Status: DC | PRN

## 2019-07-15 NOTE — Patient Instructions (Signed)
Medication Instructions:   nitroglycerin refilled  Stop taking Plavix ( clopidogrel ) , continue all other medications  *If you need a refill on your cardiac medications before your next appointment, please call your pharmacy*  Lab Work: Not needed  Testing/Procedures: Not needed Follow-Up: At [facility], you and your health needs are our priority.  As part of our continuing mission to provide you with exceptional heart care, we have created designated Provider Care Teams.  These Care Teams include your primary Cardiologist (physician) and Advanced Practice Providers (APPs -  Physician Assistants and Nurse Practitioners) who all work together to provide you with the care you need, when you need it.  Your next appointment:   6 month(s)  The format for your next appointment:   In Person  Provider:   Cherlynn Kaiser, MD

## 2019-07-16 NOTE — Progress Notes (Signed)
Cardiology Office Note:    Date:  07/15/2019   ID:  Jerry [name], DOB Mar 11, 1951, MRN [id number]  PCP:  Aurea Graff.Marlou Sa, MD  Cardiologist:  Elouise Munroe, MD  Electrophysiologist:  None   Referring MD: Aurea Graff.Marlou Sa, MD   Chief Complaint: f/u NSTEMI, status post PCI to the RCA with residual mid LAD disease.  History of Present Illness:    Jerry [name] is a 68 y.o. male with a hx of who is well-known to my clinic, with a past medical history of hypertension, hyperlipidemia, migraine headaches no longer on verapamil, who presents today for follow-up with a history of NSTEMI with PCI to the RCA and evidence of residual complex LAD disease on 05/23/2018.   He is feeling well, and has no acute concerns. He walks 3 miles 3-4 times a week and has a low rate of perceived exertion. He also participates in heavy yard working including chopping and hauling trees. He is asymptomatic during all these activities. He also does not demonstrate exercise intolerance despite his adequate beta blockade and sinus bradycardia.   He has taken DAPT for over one year and we discussed stopping plavix today. He transitioned from Brilinta to Plavix earlier this year due to cost preference for clopidogrel.  The patient denies chest pain, chest pressure, dyspnea at rest or with exertion, palpitations, PND, orthopnea, or leg swelling. Denies syncope or presyncope. Denies dizziness or lightheadedness. Denies snoring and has not been evaluated for sleep apnea.  Past Medical History:  Diagnosis Date  . High cholesterol   . History of kidney stones   . Hypertension   . Migraine    "none since ~ 2014 or before" (05/23/2018)    Past Surgical History:  Procedure Laterality Date  . CORONARY STENT INTERVENTION N/A 05/26/2018   Procedure: CORONARY STENT INTERVENTION;  Surgeon: Martinique, Peter M, MD;  Location: [place] [facility];  Service: Cardiovascular;  Laterality: N/A;  . CYSTOSCOPY W/ STONE MANIPULATION   2017  . HERNIA REPAIR    . INGUINAL HERNIA REPAIR Left 03/07/2016   Procedure: LEFT INGUINAL HERNIA REPAIR;  Surgeon: Donnie Mesa, MD;  Location: [place];  Service: General;  Laterality: Left;  . INSERTION OF MESH Left 03/07/2016   Procedure: INSERTION OF MESH;  Surgeon: Donnie Mesa, MD;  Location: [place];  Service: General;  Laterality: Left;  . LEFT HEART CATH AND CORONARY ANGIOGRAPHY N/A 05/26/2018   Procedure: LEFT HEART CATH AND CORONARY ANGIOGRAPHY;  Surgeon: Martinique, Peter M, MD;  Location: [place] [facility];  Service: Cardiovascular;  Laterality: N/A;  . LITHOTRIPSY  1990s    Current Medications: Current Meds  Medication Sig  . acetaminophen (TYLENOL) 650 MG CR tablet Take 650 mg by mouth every 8 (eight) hours as needed for pain.  Marland Kitchen aspirin EC 81 MG tablet Take 1 tablet by mouth daily.  Marland Kitchen atorvastatin (LIPITOR) 80 MG tablet Take 1 tablet (80 mg total) by mouth daily at 6 PM.  . fluticasone (FLONASE) 50 MCG/ACT nasal spray Place 2 sprays into both nostrils daily as needed for allergies.  Marland Kitchen ketoconazole (NIZORAL) 2 % cream Apply 1 application topically daily as needed (for rosacea).  . lisinopril-hydrochlorothiazide (PRINZIDE,ZESTORETIC) 20-25 MG tablet Take 1 tablet by mouth daily.   . Magnesium 250 MG TABS Take 250 mg by mouth 2 (two) times daily.  . metoprolol tartrate (LOPRESSOR) 25 MG tablet Take 0.5 tablets (12.5 mg total) by mouth 2 (two) times daily.  . mometasone (ELOCON) 0.1 % ointment  Apply 1 application topically daily as needed.  . Multiple Vitamin (MULTIVITAMIN) tablet Take 1 tablet by mouth daily.  . [DISCONTINUED] clopidogrel (PLAVIX) 75 MG tablet Take 300 mg plavix on the first day, then 75 mg daily thereafter.  . [DISCONTINUED] ticagrelor (BRILINTA) 90 MG TABS tablet Take 1 tablet (90 mg total) by mouth 2 (two) times daily.     Allergies:   Ciprofloxacin, Tamsulosin, and Chloraprep one step [chlorhexidine gluconate]   Social History   Socioeconomic History  .  Marital status: Married    Spouse name: Not on file  . Number of children: Not on file  . Years of education: Not on file  . Highest education level: Not on file  Occupational History  . Not on file  Social Needs  . Financial resource strain: Not on file  . Food insecurity    Worry: Not on file    Inability: Not on file  . Transportation needs    Medical: Not on file    Non-medical: Not on file  Tobacco Use  . Smoking status: Former Smoker    Years: 20.00    Types: Pipe    Quit date: 02/28/2000    Years since quitting: 19.3  . Smokeless tobacco: Never Used  Substance and Sexual Activity  . Alcohol use: Yes    Comment: 05/23/2018 "nothing since ~ 10/2017"  . Drug use: Never  . Sexual activity: Not on file  Lifestyle  . Physical activity    Days per week: Not on file    Minutes per session: Not on file  . Stress: Not on file  Relationships  . Social Herbalist on phone: Not on file    Gets together: Not on file    Attends religious service: Not on file    Active member of club or organization: Not on file    Attends meetings of clubs or organizations: Not on file    Relationship status: Not on file  Other Topics Concern  . Not on file  Social History Narrative  . Not on file     Family History: The patient's family history includes Hypertension in his father.  ROS:   Please see the history of present illness.    All other systems reviewed and are negative.  EKGs/Labs/Other Studies Reviewed:    The following studies were reviewed today:  EKG:  Sinus bradycardia, rate 48 bpm  Recent Labs: No results found for requested labs within last 8760 hours.  Recent Lipid Panel    Component Value Date/Time   CHOL 137 05/24/2018 0716   TRIG 61 05/24/2018 0716   HDL 40 (L) 05/24/2018 0716   CHOLHDL 3.4 05/24/2018 0716   VLDL 12 05/24/2018 0716   LDLCALC 85 05/24/2018 0716    Physical Exam:    VS:  BP (!) 142/74 (BP Location: Left Arm, Patient Position:  Sitting, Cuff Size: Normal)   Pulse (!) 48   Temp 97.6 F (36.4 C)   Ht 5\' 10"  (1.778 m)   Wt 170 lb (77.1 kg)   BMI 24.39 kg/m     Wt Readings from Last 5 Encounters:  07/15/19 170 lb (77.1 kg)  03/19/19 173 lb (78.5 kg)  09/05/18 171 lb 9.6 oz (77.8 kg)  06/09/18 175 lb 6.4 oz (79.6 kg)  05/27/18 171 lb 15.3 oz (78 kg)     Constitutional: No acute distress Eyes: sclera non-icteric, normal conjunctiva and lids ENMT: normal dentition, moist mucous membranes Cardiovascular: regular rhythm,  normal rate, no murmurs. S1 and S2 normal. Radial pulses normal bilaterally. No jugular venous distention.  Respiratory: clear to auscultation bilaterally GI : normal bowel sounds, soft and nontender. No distention.   MSK: extremities warm, well perfused. No edema.  NEURO: grossly nonfocal exam, moves all extremities. PSYCH: alert and oriented x 3, normal mood and affect.   ASSESSMENT:    1. Status post non-ST elevation myocardial infarction (NSTEMI)   2. Essential hypertension   3. Hyperlipidemia, unspecified hyperlipidemia type    PLAN:    NSTEMI with PCI 05/23/2018 - He has completed 12 months of DAPT. We discussed indications for dropping clopidogrel, and we will do so today. He understands he should continue aspirin indefinitely. Continue beta blockade and ACE-I. Will refill nitro though he has not had to use one.   HTN - patient shares his home bp logs with me which are optimal. Today's reading likely spurious, will recheck at next visit. Home readings very reassuring. No change to therapy.   HLD - lipids previously satisfactory, continues on atorvastatin 80 mg daily.   TIME SPENT WITH PATIENT: 25 minutes of direct patient care. More than 50% of that time was spent on coordination of care and counseling regarding DAPT and continued risk factor modification.  Cherlynn Kaiser, MD [place]  [facility]   Medication Adjustments/Labs and Tests Ordered: Current medicines are  reviewed at length with the patient today.  Concerns regarding medicines are outlined above.  Orders Placed This Encounter  Procedures  . EKG 12-Lead   Meds ordered this encounter  Medications  . nitroGLYCERIN (NITROSTAT) 0.4 MG SL tablet    Sig: Place 1 tablet (0.4 mg total) under the tongue every 5 (five) minutes as needed for chest pain.    Dispense:  25 tablet    Refill:  3    Patient Instructions  Medication Instructions:   nitroglycerin refilled  Stop taking Plavix ( clopidogrel ) , continue all other medications  *If you need a refill on your cardiac medications before your next appointment, please call your pharmacy*  Lab Work: Not needed  Testing/Procedures: Not needed Follow-Up: At [facility], you and your health needs are our priority.  As part of our continuing mission to provide you with exceptional heart care, we have created designated Provider Care Teams.  These Care Teams include your primary Cardiologist (physician) and Advanced Practice Providers (APPs -  Physician Assistants and Nurse Practitioners) who all work together to provide you with the care you need, when you need it.  Your next appointment:   6 month(s)  The format for your next appointment:   In Person  Provider:   Cherlynn Kaiser, MD

## 2019-07-30 DIAGNOSIS — H25813 Combined forms of age-related cataract, bilateral: Secondary | ICD-10-CM | POA: Diagnosis not present

## 2019-09-10 DIAGNOSIS — Z23 Encounter for immunization: Secondary | ICD-10-CM | POA: Diagnosis not present

## 2019-10-16 DIAGNOSIS — Z23 Encounter for immunization: Secondary | ICD-10-CM | POA: Diagnosis not present

## 2019-12-28 DIAGNOSIS — E7800 Pure hypercholesterolemia, unspecified: Secondary | ICD-10-CM | POA: Diagnosis not present

## 2019-12-28 DIAGNOSIS — L719 Rosacea, unspecified: Secondary | ICD-10-CM | POA: Diagnosis not present

## 2019-12-28 DIAGNOSIS — I10 Essential (primary) hypertension: Secondary | ICD-10-CM | POA: Diagnosis not present

## 2020-01-06 DIAGNOSIS — L570 Actinic keratosis: Secondary | ICD-10-CM | POA: Diagnosis not present

## 2020-01-06 DIAGNOSIS — D239 Other benign neoplasm of skin, unspecified: Secondary | ICD-10-CM | POA: Diagnosis not present

## 2020-01-06 DIAGNOSIS — Z85828 Personal history of other malignant neoplasm of skin: Secondary | ICD-10-CM | POA: Diagnosis not present

## 2020-01-19 ENCOUNTER — Encounter: Payer: Self-pay | Admitting: Internal Medicine

## 2020-01-19 ENCOUNTER — Ambulatory Visit (INDEPENDENT_AMBULATORY_CARE_PROVIDER_SITE_OTHER): Payer: Medicare Other | Admitting: Internal Medicine

## 2020-01-19 ENCOUNTER — Other Ambulatory Visit: Payer: Self-pay

## 2020-01-19 VITALS — BP 128/72 | HR 49 | Ht 70.0 in | Wt 171.8 lb

## 2020-01-19 DIAGNOSIS — I10 Essential (primary) hypertension: Secondary | ICD-10-CM

## 2020-01-19 DIAGNOSIS — I252 Old myocardial infarction: Secondary | ICD-10-CM | POA: Diagnosis not present

## 2020-01-19 DIAGNOSIS — E785 Hyperlipidemia, unspecified: Secondary | ICD-10-CM | POA: Diagnosis not present

## 2020-01-19 DIAGNOSIS — I214 Non-ST elevation (NSTEMI) myocardial infarction: Secondary | ICD-10-CM

## 2020-01-19 DIAGNOSIS — Z955 Presence of coronary angioplasty implant and graft: Secondary | ICD-10-CM

## 2020-01-19 NOTE — Patient Instructions (Signed)
Medication Instructions:  Your physician recommends that you continue on your current medications as directed. Please refer to the Current Medication list given to you today.   Follow-Up: At [facility], you and your health needs are our priority.  As part of our continuing mission to provide you with exceptional heart care, we have created designated Provider Care Teams.  These Care Teams include your primary Cardiologist (physician) and Advanced Practice Providers (APPs -  Physician Assistants and Nurse Practitioners) who all work together to provide you with the care you need, when you need it.  We recommend signing up for the patient portal called "MyChart".  Sign up information is provided on this After Visit Summary.  MyChart is used to connect with patients for Virtual Visits (Telemedicine).  Patients are able to view lab/test results, encounter notes, upcoming appointments, etc.  Non-urgent messages can be sent to your provider as well.   To learn more about what you can do with MyChart, go to [email or web address].    Your next appointment:   6 month(s)  The format for your next appointment:   In Person  Provider:   Cherlynn Kaiser, MD

## 2020-01-19 NOTE — Progress Notes (Signed)
Cardiology Office Note:    Date:  01/19/2020   ID:  Jerry [name], DOB [birth year], MRN [id number]  PCP:  Aurea Graff.Marlou Sa, MD  Cardiologist:  Elouise Munroe, MD  Electrophysiologist:  None   Referring MD: Aurea Graff.Marlou Sa, MD   Chief Complaint: f/u NSTEMI, status post PCI to the RCA with residual mid LAD disease.  History of Present Illness:    Jerry [name] is a 69 y.o. male with a history of hypertension, hyperlipidemia, migraine headaches previously on verapamil, who presents today for follow-up of NSTEMI with PCI to the RCA and evidence of residual complex LAD disease on 05/23/2018.  He has done well since our last visit in November 2020.  He has a mild left-sided chest twinge on occasion, but does not feel it is a significant symptom.  He continues to walk and work in the yard without significant limitation.  His blood pressure and heart rate are optimal and he shares his home logs which he records meticulously.  He had an episode of syncope with position change after his second dose of the Covid vaccine.  He recovered immediately and had no concerns regarding this episode.  He also feels he has some increased sun sensitivity after the Covid vaccine but is wearing appropriate sun protection.  He notes mild memory issues brought up by his wife.  We discussed statins and mental fogginess and recent literature to suggest no significant association.  We discussed indications for continued statin therapy in the setting of prior ACS.  Past Medical History:  Diagnosis Date  . High cholesterol   . History of kidney stones   . Hypertension   . Migraine    "none since ~ 2014 or before" (05/23/2018)    Past Surgical History:  Procedure Laterality Date  . CORONARY STENT INTERVENTION N/A 05/26/2018   Procedure: CORONARY STENT INTERVENTION;  Surgeon: Martinique, Peter M, MD;  Location: [place] [facility];  Service: Cardiovascular;  Laterality: N/A;  . CYSTOSCOPY W/ STONE MANIPULATION   2017  . HERNIA REPAIR    . INGUINAL HERNIA REPAIR Left 03/07/2016   Procedure: LEFT INGUINAL HERNIA REPAIR;  Surgeon: Donnie Mesa, MD;  Location: [place];  Service: General;  Laterality: Left;  . INSERTION OF MESH Left 03/07/2016   Procedure: INSERTION OF MESH;  Surgeon: Donnie Mesa, MD;  Location: [place];  Service: General;  Laterality: Left;  . LEFT HEART CATH AND CORONARY ANGIOGRAPHY N/A 05/26/2018   Procedure: LEFT HEART CATH AND CORONARY ANGIOGRAPHY;  Surgeon: Martinique, Peter M, MD;  Location: [place] [facility];  Service: Cardiovascular;  Laterality: N/A;  . LITHOTRIPSY  1990s    Current Medications: Current Meds  Medication Sig  . acetaminophen (TYLENOL) 650 MG CR tablet Take 650 mg by mouth every 8 (eight) hours as needed for pain.  Marland Kitchen aspirin EC 81 MG tablet Take 1 tablet by mouth daily.  Marland Kitchen atorvastatin (LIPITOR) 80 MG tablet Take 1 tablet (80 mg total) by mouth daily at 6 PM.  . fluticasone (FLONASE) 50 MCG/ACT nasal spray Place 2 sprays into both nostrils daily as needed for allergies.  Marland Kitchen ketoconazole (NIZORAL) 2 % cream Apply 1 application topically daily as needed (for rosacea).  . lisinopril-hydrochlorothiazide (PRINZIDE,ZESTORETIC) 20-25 MG tablet Take 1 tablet by mouth daily.   . Magnesium 250 MG TABS Take 250 mg by mouth 2 (two) times daily.  . metoprolol tartrate (LOPRESSOR) 25 MG tablet Take 0.5 tablets (12.5 mg total) by mouth 2 (two) times daily.  Marland Kitchen  mometasone (ELOCON) 0.1 % ointment Apply 1 application topically daily as needed.  . Multiple Vitamin (MULTIVITAMIN) tablet Take 1 tablet by mouth daily.  . nitroGLYCERIN (NITROSTAT) 0.4 MG SL tablet Place 1 tablet (0.4 mg total) under the tongue every 5 (five) minutes as needed for chest pain.     Allergies:   Ciprofloxacin, Tamsulosin, and Chloraprep one step [chlorhexidine gluconate]   Social History   Socioeconomic History  . Marital status: Married    Spouse name: Not on file  . Number of children: Not on file  .  Years of education: Not on file  . Highest education level: Not on file  Occupational History  . Not on file  Tobacco Use  . Smoking status: Former Smoker    Years: 20.00    Types: Pipe    Quit date: 02/28/2000    Years since quitting: 19.9  . Smokeless tobacco: Never Used  Substance and Sexual Activity  . Alcohol use: Yes    Comment: 05/23/2018 "nothing since ~ 10/2017"  . Drug use: Never  . Sexual activity: Not on file  Other Topics Concern  . Not on file  Social History Narrative  . Not on file   Social Determinants of Health   Financial Resource Strain:   . Difficulty of Paying Living Expenses:   Food Insecurity:   . Worried About Charity fundraiser in the Last Year:   . Arboriculturist in the Last Year:   Transportation Needs:   . Film/video editor (Medical):   Marland Kitchen Lack of Transportation (Non-Medical):   Physical Activity:   . Days of Exercise per Week:   . Minutes of Exercise per Session:   Stress:   . Feeling of Stress :   Social Connections:   . Frequency of Communication with Friends and Family:   . Frequency of Social Gatherings with Friends and Family:   . Attends Religious Services:   . Active Member of Clubs or Organizations:   . Attends Archivist Meetings:   Marland Kitchen Marital Status:      Family History: The patient's family history includes Hypertension in his father.  ROS:   Please see the history of present illness.    All other systems reviewed and are negative.  EKGs/Labs/Other Studies Reviewed:    The following studies were reviewed today:  EKG:  Sinus brady 49  Recent Labs: No results found for requested labs within last 8760 hours.  Recent Lipid Panel    Component Value Date/Time   CHOL 137 05/24/2018 0716   TRIG 61 05/24/2018 0716   HDL 40 (L) 05/24/2018 0716   CHOLHDL 3.4 05/24/2018 0716   VLDL 12 05/24/2018 0716   LDLCALC 85 05/24/2018 0716    Physical Exam:    VS:  BP 128/72   Pulse (!) 49   Ht 5\' 10"  (1.778 m)    Wt 171 lb 12.8 oz (77.9 kg)   BMI 24.65 kg/m     Wt Readings from Last 5 Encounters:  01/19/20 171 lb 12.8 oz (77.9 kg)  07/15/19 170 lb (77.1 kg)  03/19/19 173 lb (78.5 kg)  09/05/18 171 lb 9.6 oz (77.8 kg)  06/09/18 175 lb 6.4 oz (79.6 kg)     Constitutional: No acute distress Eyes: sclera non-icteric, normal conjunctiva and lids ENMT: normal dentition, moist mucous membranes Cardiovascular: regular rhythm, normal rate, no murmurs. S1 and S2 normal. Radial pulses normal bilaterally. No jugular venous distention.  Respiratory: clear to auscultation  bilaterally GI : normal bowel sounds, soft and nontender. No distention.   MSK: extremities warm, well perfused. No edema.  NEURO: grossly nonfocal exam, moves all extremities. PSYCH: alert and oriented x 3, normal mood and affect.   ASSESSMENT:    1. Status post non-ST elevation myocardial infarction (NSTEMI)   2. Non-ST elevation (NSTEMI) myocardial infarction ([place])   3. Essential hypertension   4. Hyperlipidemia, unspecified hyperlipidemia type   5. Status post insertion of drug-eluting stent into right coronary artery for coronary artery disease    PLAN:    Status post non-ST elevation myocardial infarction (NSTEMI) - Plan: EKG 12-Lead  Non-ST elevation (NSTEMI) myocardial infarction [facility])  Essential hypertension  Hyperlipidemia, unspecified hyperlipidemia type  Status post insertion of drug-eluting stent into right coronary artery for coronary artery disease   He is doing well after his NSTEMI in October 2019.  Continues on indefinite 81 mg aspirin therapy and understands this is for his stent.  Continues on atorvastatin 80 mg daily for secondary prevention of CAD and is tolerating well.  Has a history of hypertension and takes lisinopril HCTZ as well as metoprolol tartrate, blood pressure is optimized, continue current doses.  He has not required any sublingual nitroglycerin, however for these minor twinges in his  chest he certainly welcome to take sublingual nitro as needed.  He feels he will not need to as these are minor irritations.  Total time of encounter: 30 minutes total time of encounter, including 20 minutes spent in face-to-face patient care on the date of this encounter. This time includes coordination of care and counseling regarding above mentioned problem list. Remainder of non-face-to-face time involved reviewing chart documents/testing relevant to the patient encounter and documentation in the medical record. I have independently reviewed documentation from referring provider.   Cherlynn Kaiser, MD [place]  [facility]    Medication Adjustments/Labs and Tests Ordered: Current medicines are reviewed at length with the patient today.  Concerns regarding medicines are outlined above.  Orders Placed This Encounter  Procedures  . EKG 12-Lead   No orders of the defined types were placed in this encounter.   Patient Instructions  Medication Instructions:  Your physician recommends that you continue on your current medications as directed. Please refer to the Current Medication list given to you today.   Follow-Up: At [facility], you and your health needs are our priority.  As part of our continuing mission to provide you with exceptional heart care, we have created designated Provider Care Teams.  These Care Teams include your primary Cardiologist (physician) and Advanced Practice Providers (APPs -  Physician Assistants and Nurse Practitioners) who all work together to provide you with the care you need, when you need it.  We recommend signing up for the patient portal called "MyChart".  Sign up information is provided on this After Visit Summary.  MyChart is used to connect with patients for Virtual Visits (Telemedicine).  Patients are able to view lab/test results, encounter notes, upcoming appointments, etc.  Non-urgent messages can be sent to your provider as well.   To  learn more about what you can do with MyChart, go to [email or web address].    Your next appointment:   6 month(s)  The format for your next appointment:   In Person  Provider:   Cherlynn Kaiser, MD

## 2020-06-28 DIAGNOSIS — Z85828 Personal history of other malignant neoplasm of skin: Secondary | ICD-10-CM | POA: Diagnosis not present

## 2020-06-28 DIAGNOSIS — L570 Actinic keratosis: Secondary | ICD-10-CM | POA: Diagnosis not present

## 2020-06-28 DIAGNOSIS — H00012 Hordeolum externum right lower eyelid: Secondary | ICD-10-CM | POA: Diagnosis not present

## 2020-07-01 DIAGNOSIS — Z0000 Encounter for general adult medical examination without abnormal findings: Secondary | ICD-10-CM | POA: Diagnosis not present

## 2020-07-01 DIAGNOSIS — I10 Essential (primary) hypertension: Secondary | ICD-10-CM | POA: Diagnosis not present

## 2020-07-01 DIAGNOSIS — E7800 Pure hypercholesterolemia, unspecified: Secondary | ICD-10-CM | POA: Diagnosis not present

## 2020-07-01 DIAGNOSIS — Z125 Encounter for screening for malignant neoplasm of prostate: Secondary | ICD-10-CM | POA: Diagnosis not present

## 2020-07-25 ENCOUNTER — Other Ambulatory Visit: Payer: Self-pay

## 2020-07-25 ENCOUNTER — Ambulatory Visit (INDEPENDENT_AMBULATORY_CARE_PROVIDER_SITE_OTHER): Payer: Medicare Other | Admitting: Internal Medicine

## 2020-07-25 ENCOUNTER — Encounter: Payer: Self-pay | Admitting: Internal Medicine

## 2020-07-25 VITALS — BP 128/60 | HR 50 | Ht 70.5 in | Wt 170.4 lb

## 2020-07-25 DIAGNOSIS — I358 Other nonrheumatic aortic valve disorders: Secondary | ICD-10-CM

## 2020-07-25 DIAGNOSIS — I10 Essential (primary) hypertension: Secondary | ICD-10-CM

## 2020-07-25 DIAGNOSIS — I214 Non-ST elevation (NSTEMI) myocardial infarction: Secondary | ICD-10-CM

## 2020-07-25 DIAGNOSIS — E785 Hyperlipidemia, unspecified: Secondary | ICD-10-CM | POA: Diagnosis not present

## 2020-07-25 NOTE — Progress Notes (Signed)
Cardiology Office Note:    Date:  07/25/2020   ID:  Jerry [name], DOB [birth year], MRN [id number]  PCP:  Jerry Graff.Marlou Sa, MD  Cardiologist:  Elouise Munroe, MD  Electrophysiologist:  None   Referring MD: Jerry Graff.Marlou Sa, MD   Chief Complaint/Reason for Referral: CAD status post NSTEMI with PCI to the RCA and residual mid LAD disease medically managed  History of Present Illness:    Jerry [name] is a 69 y.o. male with history of hypertension, hyperlipidemia, migraine headaches previously on verapamil, who presents today for follow-up of NSTEMI with PCI to the RCA and evidence of residual complex LAD disease on 05/23/2018.   He has been feeling quite well since our last follow-up.  No additional episodes of syncope, that was an isolated incident.  He thinks it was related to position change.  We had stopped his verapamil due to bradycardia at the time of his MI.  He fortunately has had no recurrence of migraines and thinks that these were related to work as they have not occurred again after retirement.  He occasionally will have a twinge in his chest, but it is infrequent and not associated with exertion.  He is watching these but has not had to take a nitroglycerin.  The patient denies chest pressure, dyspnea at rest or with exertion, palpitations, PND, orthopnea, or leg swelling. Denies cough, fever, chills. Denies nausea, vomiting. Denies syncope or presyncope. Denies dizziness or lightheadedness. Denies snoring.  Past Medical History:  Diagnosis Date  . High cholesterol   . History of kidney stones   . Hypertension   . Migraine    "none since ~ 2014 or before" (05/23/2018)    Past Surgical History:  Procedure Laterality Date  . CORONARY STENT INTERVENTION N/A 05/26/2018   Procedure: CORONARY STENT INTERVENTION;  Surgeon: Martinique, Peter M, MD;  Location: [facility];  Service: Cardiovascular;  Laterality: N/A;  . CYSTOSCOPY W/ STONE MANIPULATION  2017  .  HERNIA REPAIR    . INGUINAL HERNIA REPAIR Left 03/07/2016   Procedure: LEFT INGUINAL HERNIA REPAIR;  Surgeon: Donnie Mesa, MD;  Location: [place] [facility];  Service: General;  Laterality: Left;  . INSERTION OF MESH Left 03/07/2016   Procedure: INSERTION OF MESH;  Surgeon: Donnie Mesa, MD;  Location: [place];  Service: General;  Laterality: Left;  . LEFT HEART CATH AND CORONARY ANGIOGRAPHY N/A 05/26/2018   Procedure: LEFT HEART CATH AND CORONARY ANGIOGRAPHY;  Surgeon: Martinique, Peter M, MD;  Location: [place] [facility];  Service: Cardiovascular;  Laterality: N/A;  . LITHOTRIPSY  1990s    Current Medications: Current Meds  Medication Sig  . acetaminophen (TYLENOL) 650 MG CR tablet Take 650 mg by mouth every 8 (eight) hours as needed for pain.  Marland Kitchen aspirin EC 81 MG tablet Take 1 tablet by mouth daily.  . fluticasone (FLONASE) 50 MCG/ACT nasal spray Place 2 sprays into both nostrils daily as needed for allergies.  Marland Kitchen ketoconazole (NIZORAL) 2 % cream Apply 1 application topically daily as needed (for rosacea).  . lisinopril-hydrochlorothiazide (PRINZIDE,ZESTORETIC) 20-25 MG tablet Take 1 tablet by mouth daily.   . Magnesium 250 MG TABS Take 250 mg by mouth 2 (two) times daily.  . mometasone (ELOCON) 0.1 % ointment Apply 1 application topically daily as needed.  . Multiple Vitamin (MULTIVITAMIN) tablet Take 1 tablet by mouth daily.     Allergies:   Ciprofloxacin, Tamsulosin, and Chloraprep one step [chlorhexidine gluconate]   Social History   Tobacco Use  .  Smoking status: Former Smoker    Years: 20.00    Types: Pipe    Quit date: 02/28/2000    Years since quitting: 20.4  . Smokeless tobacco: Never Used  Vaping Use  . Vaping Use: Never used  Substance Use Topics  . Alcohol use: Yes    Comment: 05/23/2018 "nothing since ~ 10/2017"  . Drug use: Never     Family History: The patient's family history includes Hypertension in his father.  ROS:   Please see the history of present illness.    All  other systems reviewed and are negative.  EKGs/Labs/Other Studies Reviewed:    The following studies were reviewed today:  EKG: Sinus bradycardia rate 50, T wave flattening in lead III  Recent Labs: No results found for requested labs within last 8760 hours.  Recent Lipid Panel    Component Value Date/Time   CHOL 137 05/24/2018 0716   TRIG 61 05/24/2018 0716   HDL 40 (L) 05/24/2018 0716   CHOLHDL 3.4 05/24/2018 0716   VLDL 12 05/24/2018 0716   LDLCALC 85 05/24/2018 0716    Physical Exam:    VS:  BP 128/60 (BP Location: Left Arm, Patient Position: Sitting)   Pulse (!) 50   Ht 5' 10.5" (1.791 m)   Wt 170 lb 6.4 oz (77.3 kg)   SpO2 97%   BMI 24.10 kg/m     Wt Readings from Last 5 Encounters:  07/25/20 170 lb 6.4 oz (77.3 kg)  01/19/20 171 lb 12.8 oz (77.9 kg)  07/15/19 170 lb (77.1 kg)  03/19/19 173 lb (78.5 kg)  09/05/18 171 lb 9.6 oz (77.8 kg)    Constitutional: No acute distress Eyes: sclera non-icteric, normal conjunctiva and lids ENMT: normal dentition, moist mucous membranes Cardiovascular: regular rhythm, bradycardic, 1/6 mid peaking systolic ejection murmur. S1 and S2 normal. Radial pulses normal bilaterally. No jugular venous distention.  Respiratory: clear to auscultation bilaterally GI : normal bowel sounds, soft and nontender. No distention.   MSK: extremities warm, well perfused. No edema.  NEURO: grossly nonfocal exam, moves all extremities. PSYCH: alert and oriented x 3, normal mood and affect.   ASSESSMENT:    1. Non-ST elevation (NSTEMI) myocardial infarction ([place])   2. Essential hypertension   3. Hyperlipidemia, unspecified hyperlipidemia type   4. Aortic valve sclerosis    PLAN:    NSTEMI -He has had PCI to the RCA and has residual LAD disease that we are medically managing.  No chest pain -Continue aspirin 81 mg daily, atorvastatin 80 mg daily, metoprolol tartrate 12.5 mg twice daily.  He has baseline bradycardia and we are using low-dose  beta-blockade which she is tolerating well.  No recent dizziness or syncope.  Hypertension-continue lisinopril HCTZ 20-25 mg and low-dose metoprolol.  Blood pressure is stable today.  Hyperlipidemia-continue atorvastatin 80 mg daily, lipids are stable.  Labs from PCP reviewed.  Systolic murmur-he has aortic valve sclerosis on his echo from 2 years ago.  His murmur on exam today has no worrisome features.  We will screen for aortic valve stenosis in a year or 2 with echocardiogram.   Total time of encounter: 30 minutes total time of encounter, including 20 minutes spent in face-to-face patient care on the date of this encounter. This time includes coordination of care and counseling regarding above mentioned problem list. Remainder of non-face-to-face time involved reviewing chart documents/testing relevant to the patient encounter and documentation in the medical record. I have independently reviewed documentation from referring provider.  Cherlynn Kaiser, MD [place]  [facility]    Medication Adjustments/Labs and Tests Ordered: Current medicines are reviewed at length with the patient today.  Concerns regarding medicines are outlined above.   Orders Placed This Encounter  Procedures  . EKG 12-Lead   No orders of the defined types were placed in this encounter.   Patient Instructions  Medication Instructions:  No Changes In Medications at this time.  *If you need a refill on your cardiac medications before your next appointment, please call your pharmacy*  Follow-Up: At [facility], you and your health needs are our priority.  As part of our continuing mission to provide you with exceptional heart care, we have created designated Provider Care Teams.  These Care Teams include your primary Cardiologist (physician) and Advanced Practice Providers (APPs -  Physician Assistants and Nurse Practitioners) who all work together to provide you with the care you need, when you need  it.  Your next appointment:   1 year(s)  The format for your next appointment:   In Person  Provider:   Cherlynn Kaiser, MD

## 2020-07-25 NOTE — Patient Instructions (Signed)

## 2020-08-04 DIAGNOSIS — Z23 Encounter for immunization: Secondary | ICD-10-CM | POA: Diagnosis not present

## 2020-12-23 DIAGNOSIS — H25813 Combined forms of age-related cataract, bilateral: Secondary | ICD-10-CM | POA: Diagnosis not present

## 2020-12-29 DIAGNOSIS — N4889 Other specified disorders of penis: Secondary | ICD-10-CM | POA: Diagnosis not present

## 2020-12-29 DIAGNOSIS — I10 Essential (primary) hypertension: Secondary | ICD-10-CM | POA: Diagnosis not present

## 2020-12-29 DIAGNOSIS — E7800 Pure hypercholesterolemia, unspecified: Secondary | ICD-10-CM | POA: Diagnosis not present

## 2020-12-29 DIAGNOSIS — M25552 Pain in left hip: Secondary | ICD-10-CM | POA: Diagnosis not present

## 2021-01-07 DIAGNOSIS — Z20822 Contact with and (suspected) exposure to covid-19: Secondary | ICD-10-CM | POA: Diagnosis not present

## 2021-03-15 DIAGNOSIS — N200 Calculus of kidney: Secondary | ICD-10-CM | POA: Diagnosis not present

## 2021-03-15 DIAGNOSIS — Z87442 Personal history of urinary calculi: Secondary | ICD-10-CM | POA: Diagnosis not present

## 2021-03-15 DIAGNOSIS — R310 Gross hematuria: Secondary | ICD-10-CM | POA: Diagnosis not present

## 2021-03-15 DIAGNOSIS — N400 Enlarged prostate without lower urinary tract symptoms: Secondary | ICD-10-CM | POA: Diagnosis not present

## 2021-03-17 ENCOUNTER — Telehealth: Payer: Self-pay | Admitting: Internal Medicine

## 2021-03-17 NOTE — Telephone Encounter (Signed)
Pt c/o BP issue: STAT if pt c/o blurred vision, one-sided weakness or slurred speech  1. What are your last 5 BP readings? This morning 130/72 HR 48  2. Are you having any other symptoms (ex. Dizziness, headache, blurred vision, passed out)? If he bends over and come up he feels dizzy  3. What is your BP issue? Patient states that 130 is high for him HR low

## 2021-03-17 NOTE — Telephone Encounter (Signed)
Spoke with pt. He report for the past couple of weeks he's been experiencing dizziness whenever he bend down and stand back up. He report it's not severe, but enough to make him have to bend on one knee for a few minutes before standing. BP and HR listed below.    7/29-130/72 HR 48 (8 am) 149/78 HR 51 (current) 7/23- 124/73 HR 55 6/2-127/63 HR HR 55  Pt denies symptoms currently. Appointment scheduled for 8/3 with Coletta Memos, NP at [place] location. Pt made aware of ED precaution should any new symptoms develop or worsen. Pt verbalized understanding.

## 2021-03-17 NOTE — Telephone Encounter (Addendum)
Pt made aware of the following message list below and agreeable to plan. Appointment rescheduled for 8/11 with Dr. Margaretann Loveless.   From Dr. Margaretann Loveless  I would like to see him personally, please offer him an appt with me 8/11 at 10:40 if he'd like. Otherwise can keep appt with app he he prefers.

## 2021-03-22 ENCOUNTER — Ambulatory Visit (HOSPITAL_BASED_OUTPATIENT_CLINIC_OR_DEPARTMENT_OTHER): Payer: Medicare Other | Admitting: General Practice

## 2021-03-29 DIAGNOSIS — N200 Calculus of kidney: Secondary | ICD-10-CM | POA: Diagnosis not present

## 2021-03-29 DIAGNOSIS — N400 Enlarged prostate without lower urinary tract symptoms: Secondary | ICD-10-CM | POA: Diagnosis not present

## 2021-03-30 ENCOUNTER — Ambulatory Visit (INDEPENDENT_AMBULATORY_CARE_PROVIDER_SITE_OTHER): Payer: Medicare Other | Admitting: Internal Medicine

## 2021-03-30 ENCOUNTER — Other Ambulatory Visit: Payer: Self-pay

## 2021-03-30 VITALS — BP 116/62 | HR 51 | Ht 70.5 in | Wt 172.0 lb

## 2021-03-30 DIAGNOSIS — I10 Essential (primary) hypertension: Secondary | ICD-10-CM | POA: Diagnosis not present

## 2021-03-30 DIAGNOSIS — E785 Hyperlipidemia, unspecified: Secondary | ICD-10-CM

## 2021-03-30 DIAGNOSIS — R42 Dizziness and giddiness: Secondary | ICD-10-CM

## 2021-03-30 DIAGNOSIS — I214 Non-ST elevation (NSTEMI) myocardial infarction: Secondary | ICD-10-CM

## 2021-03-30 DIAGNOSIS — I2510 Atherosclerotic heart disease of native coronary artery without angina pectoris: Secondary | ICD-10-CM

## 2021-03-30 DIAGNOSIS — Z79899 Other long term (current) drug therapy: Secondary | ICD-10-CM

## 2021-03-30 NOTE — Progress Notes (Signed)
Cardiology Office Note:    Date:  03/30/2021   ID:  [name], DOB 1951/01/18, MRN [id number]  PCP:  Aurea Graff.Marlou Sa, MD  Cardiologist:  Elouise Munroe, MD  Electrophysiologist:  None   Referring MD: Aurea Graff.Marlou Sa, MD   Chief Complaint/Reason for Referral: Positional dizziness CAD status post NSTEMI with PCI to the RCA and residual mid LAD disease medically managed  History of Present Illness:    Jerry [name] is a 70 y.o. male with history of hypertension, hyperlipidemia, migraine headaches previously on verapamil, who presents today for follow-up of NSTEMI with PCI to the RCA and evidence of residual complex LAD disease on 05/23/2018.   He called into triage at the end of July with positional dizziness over the past couple of weeks. He will have to bend on one knee and regain his balance before he can stand back up. BP readings have been overall stable, HR is consistently low. No additional episodes of syncope, that was an isolated incident.  He thinks it was related to position change. We had stopped his verapamil due to bradycardia at the time of his MI.  He fortunately has had no recurrence of migraines and thinks that these were related to work as they have not occurred again after retirement. No significant chest pain though occasionally has twinges in chest, we've discussed these before. We discussed worsening, likely iatrogenic bradycardia over time while on metoprolol. Discussed possibility of conduction system disease.   The patient denies chest pain, chest pressure, dyspnea at rest or with exertion, palpitations, PND, orthopnea, or leg swelling. Denies cough, fever, chills. Denies nausea, vomiting. Denies syncope.    Past Medical History:  Diagnosis Date   High cholesterol    History of kidney stones    Hypertension    Migraine    "none since ~ 2014 or before" (05/23/2018)    Past Surgical History:  Procedure Laterality Date   CORONARY STENT INTERVENTION  N/A 05/26/2018   Procedure: CORONARY STENT INTERVENTION;  Surgeon: Martinique, Peter M, MD;  Location: [facility];  Service: Cardiovascular;  Laterality: N/A;   CYSTOSCOPY W/ STONE MANIPULATION  2017   HERNIA REPAIR     INGUINAL HERNIA REPAIR Left 03/07/2016   Procedure: LEFT INGUINAL HERNIA REPAIR;  Surgeon: Donnie Mesa, MD;  Location: [place];  Service: General;  Laterality: Left;   INSERTION OF MESH Left 03/07/2016   Procedure: INSERTION OF MESH;  Surgeon: Donnie Mesa, MD;  Location: [place];  Service: General;  Laterality: Left;   LEFT HEART CATH AND CORONARY ANGIOGRAPHY N/A 05/26/2018   Procedure: LEFT HEART CATH AND CORONARY ANGIOGRAPHY;  Surgeon: Martinique, Peter M, MD;  Location: [facility];  Service: Cardiovascular;  Laterality: N/A;   LITHOTRIPSY  1990s    Current Medications: Current Meds  Medication Sig   acetaminophen (TYLENOL) 650 MG CR tablet Take 650 mg by mouth every 8 (eight) hours as needed for pain.   aspirin EC 81 MG tablet Take 1 tablet by mouth daily.   fluticasone (FLONASE) 50 MCG/ACT nasal spray Place 2 sprays into both nostrils daily as needed for allergies.   ketoconazole (NIZORAL) 2 % cream Apply 1 application topically daily as needed (for rosacea).   lisinopril-hydrochlorothiazide (PRINZIDE,ZESTORETIC) 20-25 MG tablet Take 1 tablet by mouth daily.    Magnesium 250 MG TABS Take 250 mg by mouth 2 (two) times daily.   mometasone (ELOCON) 0.1 % ointment Apply 1 application topically daily as needed.   Multiple  Vitamin (MULTIVITAMIN) tablet Take 1 tablet by mouth daily.     Allergies:   Ciprofloxacin, Tamsulosin, Chloraprep one step [chlorhexidine gluconate], and Sulfamethoxazole   Social History   Tobacco Use   Smoking status: Former    Types: Pipe    Quit date: 02/28/2000    Years since quitting: 21.0   Smokeless tobacco: Never  Vaping Use   Vaping Use: Never used  Substance Use Topics   Alcohol use: Yes    Comment: 05/23/2018 "nothing since ~  10/2017"   Drug use: Never     Family History: The patient's family history includes Hypertension in his father.  ROS:   Please see the history of present illness.    All other systems reviewed and are negative.  EKGs/Labs/Other Studies Reviewed:    The following studies were reviewed today:  EKG: sinus brady rate 51 bpm, nonspec T wave abnormal  Prior: Sinus bradycardia rate 50, T wave flattening in lead III  Recent Labs: No results found for requested labs within last 8760 hours.  Recent Lipid Panel    Component Value Date/Time   CHOL 137 05/24/2018 0716   TRIG 61 05/24/2018 0716   HDL 40 (L) 05/24/2018 0716   CHOLHDL 3.4 05/24/2018 0716   VLDL 12 05/24/2018 0716   LDLCALC 85 05/24/2018 0716    Physical Exam:    VS:  BP 116/62 (BP Location: Left Arm, Patient Position: Sitting, Cuff Size: Normal)   Pulse (!) 51   Ht 5' 10.5" (1.791 m)   Wt 172 lb (78 kg)   BMI 24.33 kg/m     Wt Readings from Last 5 Encounters:  03/30/21 172 lb (78 kg)  07/25/20 170 lb 6.4 oz (77.3 kg)  01/19/20 171 lb 12.8 oz (77.9 kg)  07/15/19 170 lb (77.1 kg)  03/19/19 173 lb (78.5 kg)    Constitutional: No acute distress Eyes: sclera non-icteric, normal conjunctiva and lids ENMT: normal dentition, moist mucous membranes Cardiovascular: regular rhythm, bradycardic, 1/6 mid peaking systolic ejection murmur. S1 and S2 normal. Radial pulses normal bilaterally. No jugular venous distention.  Respiratory: clear to auscultation bilaterally GI : normal bowel sounds, soft and nontender. No distention.   MSK: extremities warm, well perfused. No edema.  NEURO: grossly nonfocal exam, moves all extremities. PSYCH: alert and oriented x 3, normal mood and affect.   ASSESSMENT:    1. Dizziness   2. Essential hypertension   3. Coronary artery disease involving native coronary artery of native heart without angina pectoris   4. Non-ST elevation (NSTEMI) myocardial infarction ([place])   5.  Hyperlipidemia, unspecified hyperlipidemia type   6. Medication management    PLAN:    Dizziness Presyncope Med management - concern for bradycardia and chronotropic incompetence as possible etiology. Not orthostatic today in office.  - will stop metoprolol and evaluate in close follow up how he is feeling. Encouraged continue compression stockings and adequate hydration.  - it has been 3 years since last echo, in light of worsening symptoms will repeat now.   NSTEMI -He has had PCI to the RCA and has residual LAD disease that we are medically managing.  No significant chest pain -Continue aspirin 81 mg daily, atorvastatin 80 mg daily, stop BB as above.   Hypertension-continue lisinopril HCTZ 20-25 mg.  Blood pressure is stable today and may normalize further off of BB.  Hyperlipidemia-continue atorvastatin 80 mg daily, lipids are stable.  Total time of encounter: 30 minutes total time of encounter, including 25 minutes spent  in face-to-face patient care on the date of this encounter. This time includes coordination of care and counseling regarding above mentioned problem list. Remainder of non-face-to-face time involved reviewing chart documents/testing relevant to the patient encounter and documentation in the medical record. I have independently reviewed documentation from referring provider.   Cherlynn Kaiser, MD, [facility]     Medication Adjustments/Labs and Tests Ordered: Current medicines are reviewed at length with the patient today.  Concerns regarding medicines are outlined above.   Orders Placed This Encounter  Procedures   EKG 12-Lead   ECHOCARDIOGRAM COMPLETE    No orders of the defined types were placed in this encounter.   Patient Instructions  Medication Instructions:  STOP: METOPROLOL  *If you need a refill on your cardiac medications before your next appointment, please call your pharmacy*  Testing/Procedures: Your physician has  requested that you have an echocardiogram. Echocardiography is a painless test that uses sound waves to create images of your heart. It provides your doctor with information about the size and shape of your heart and how well your heart's chambers and valves are working. You may receive an ultrasound enhancing agent through an IV if needed to better visualize your heart during the echo.This procedure takes approximately one hour. There are no restrictions for this procedure. This will take place at the [address].   Follow-Up: At [facility], you and your health needs are our priority.  As part of our continuing mission to provide you with exceptional heart care, we have created designated Provider Care Teams.  These Care Teams include your primary Cardiologist (physician) and Advanced Practice Providers (APPs -  Physician Assistants and Nurse Practitioners) who all work together to provide you with the care you need, when you need it.  Your next appointment:   9/23 at 9:00AM  The format for your next appointment:   In Person  Provider:   Cherlynn Kaiser, MD  PLEASE START [place]. YOU CAN BUY THESE ONLINE OR AT [place]

## 2021-03-30 NOTE — Patient Instructions (Signed)
Medication Instructions:  STOP: METOPROLOL  *If you need a refill on your cardiac medications before your next appointment, please call your pharmacy*  Testing/Procedures: Your physician has requested that you have an echocardiogram. Echocardiography is a painless test that uses sound waves to create images of your heart. It provides your doctor with information about the size and shape of your heart and how well your heart's chambers and valves are working. You may receive an ultrasound enhancing agent through an IV if needed to better visualize your heart during the echo.This procedure takes approximately one hour. There are no restrictions for this procedure. This will take place at the [address].   Follow-Up: At [facility], you and your health needs are our priority.  As part of our continuing mission to provide you with exceptional heart care, we have created designated Provider Care Teams.  These Care Teams include your primary Cardiologist (physician) and Advanced Practice Providers (APPs -  Physician Assistants and Nurse Practitioners) who all work together to provide you with the care you need, when you need it.  Your next appointment:   9/23 at 9:00AM  The format for your next appointment:   In Person  Provider:   Cherlynn Kaiser, MD  PLEASE START [place]. YOU CAN BUY THESE ONLINE OR AT [place]

## 2021-04-10 ENCOUNTER — Other Ambulatory Visit: Payer: Self-pay | Admitting: Internal Medicine

## 2021-04-10 DIAGNOSIS — I252 Old myocardial infarction: Secondary | ICD-10-CM

## 2021-04-12 DIAGNOSIS — N401 Enlarged prostate with lower urinary tract symptoms: Secondary | ICD-10-CM | POA: Diagnosis not present

## 2021-04-12 DIAGNOSIS — Z87448 Personal history of other diseases of urinary system: Secondary | ICD-10-CM | POA: Diagnosis not present

## 2021-04-12 DIAGNOSIS — Z87442 Personal history of urinary calculi: Secondary | ICD-10-CM | POA: Diagnosis not present

## 2021-04-12 DIAGNOSIS — R3914 Feeling of incomplete bladder emptying: Secondary | ICD-10-CM | POA: Diagnosis not present

## 2021-04-20 ENCOUNTER — Other Ambulatory Visit: Payer: Self-pay

## 2021-04-20 ENCOUNTER — Ambulatory Visit (HOSPITAL_COMMUNITY): Payer: Medicare Other | Attending: Cardiology

## 2021-04-20 DIAGNOSIS — R42 Dizziness and giddiness: Secondary | ICD-10-CM | POA: Insufficient documentation

## 2021-04-20 DIAGNOSIS — I2510 Atherosclerotic heart disease of native coronary artery without angina pectoris: Secondary | ICD-10-CM | POA: Insufficient documentation

## 2021-04-20 LAB — ECHOCARDIOGRAM COMPLETE
AR max vel: 2.98 cm2
AV Area VTI: 2.87 cm2
AV Area mean vel: 2.8 cm2
AV Mean grad: 7.3 mmHg
AV Peak grad: 14.5 mmHg
Ao pk vel: 1.9 m/s
Area-P 1/2: 2.69 cm2
S' Lateral: 2.3 cm

## 2021-04-20 MED ORDER — PERFLUTREN LIPID MICROSPHERE
1.0000 mL | INTRAVENOUS | Status: AC | PRN
Start: 2021-04-20 — End: 2021-04-20
  Administered 2021-04-20: 2 mL via INTRAVENOUS

## 2021-04-25 ENCOUNTER — Ambulatory Visit: Payer: Medicare Other | Admitting: Internal Medicine

## 2021-05-12 ENCOUNTER — Ambulatory Visit: Payer: Medicare Other | Admitting: Internal Medicine

## 2021-05-12 ENCOUNTER — Ambulatory Visit (INDEPENDENT_AMBULATORY_CARE_PROVIDER_SITE_OTHER): Payer: Medicare Other | Admitting: Internal Medicine

## 2021-05-12 ENCOUNTER — Other Ambulatory Visit: Payer: Self-pay

## 2021-05-12 ENCOUNTER — Encounter: Payer: Self-pay | Admitting: Internal Medicine

## 2021-05-12 VITALS — BP 116/58 | HR 65 | Ht 70.0 in | Wt 170.0 lb

## 2021-05-12 DIAGNOSIS — R42 Dizziness and giddiness: Secondary | ICD-10-CM | POA: Diagnosis not present

## 2021-05-12 DIAGNOSIS — R55 Syncope and collapse: Secondary | ICD-10-CM | POA: Diagnosis not present

## 2021-05-12 DIAGNOSIS — I2510 Atherosclerotic heart disease of native coronary artery without angina pectoris: Secondary | ICD-10-CM | POA: Diagnosis not present

## 2021-05-12 DIAGNOSIS — I10 Essential (primary) hypertension: Secondary | ICD-10-CM

## 2021-05-12 DIAGNOSIS — E785 Hyperlipidemia, unspecified: Secondary | ICD-10-CM

## 2021-05-12 DIAGNOSIS — I214 Non-ST elevation (NSTEMI) myocardial infarction: Secondary | ICD-10-CM

## 2021-05-12 DIAGNOSIS — Z79899 Other long term (current) drug therapy: Secondary | ICD-10-CM

## 2021-05-12 NOTE — Progress Notes (Signed)
Cardiology Office Note:    Date:  05/12/2021   ID:  JULIO ZAPPIA, DOB [birth year], MRN [id number]  PCP:  Aurea Graff.Marlou Sa, MD  Cardiologist:  Elouise Munroe, MD  Electrophysiologist:  None   Referring MD: Aurea Graff.Marlou Sa, MD   Chief Complaint/Reason for Referral: Positional dizziness CAD status post NSTEMI with PCI to the RCA and residual mid LAD disease medically managed  History of Present Illness:    Jerry [name] is a 70 y.o. male with history of hypertension, hyperlipidemia, migraine headaches previously on verapamil, who presents today for follow-up of NSTEMI with PCI to the RCA and evidence of residual complex LAD disease on 05/23/2018.   Feeling much better off of metoprolol. Only occasional positional dizziness. Has not yet started wearing compression socks.  The patient denies chest pain, chest pressure, dyspnea at rest or with exertion, palpitations, PND, orthopnea, or leg swelling. Denies cough, fever, chills. Denies nausea, vomiting. Denies syncope. Denies snoring.  Past Medical History:  Diagnosis Date   High cholesterol    History of kidney stones    Hypertension    Migraine    "none since ~ 2014 or before" (05/23/2018)    Past Surgical History:  Procedure Laterality Date   CORONARY STENT INTERVENTION N/A 05/26/2018   Procedure: CORONARY STENT INTERVENTION;  Surgeon: Martinique, Peter M, MD;  Location: [place] [facility];  Service: Cardiovascular;  Laterality: N/A;   CYSTOSCOPY W/ STONE MANIPULATION  2017   HERNIA REPAIR     INGUINAL HERNIA REPAIR Left 03/07/2016   Procedure: LEFT INGUINAL HERNIA REPAIR;  Surgeon: Donnie Mesa, MD;  Location: [place];  Service: General;  Laterality: Left;   INSERTION OF MESH Left 03/07/2016   Procedure: INSERTION OF MESH;  Surgeon: Donnie Mesa, MD;  Location: [place];  Service: General;  Laterality: Left;   LEFT HEART CATH AND CORONARY ANGIOGRAPHY N/A 05/26/2018   Procedure: LEFT HEART CATH AND CORONARY ANGIOGRAPHY;  Surgeon:  Martinique, Peter M, MD;  Location: [place] [facility];  Service: Cardiovascular;  Laterality: N/A;   LITHOTRIPSY  1990s    Current Medications: Current Meds  Medication Sig   acetaminophen (TYLENOL) 650 MG CR tablet Take 650 mg by mouth every 8 (eight) hours as needed for pain.   aspirin EC 81 MG tablet Take 1 tablet by mouth daily.   atorvastatin (LIPITOR) 80 MG tablet Take 1 tablet (80 mg total) by mouth daily at 6 PM.   fluticasone (FLONASE) 50 MCG/ACT nasal spray Place 2 sprays into both nostrils daily as needed for allergies.   ketoconazole (NIZORAL) 2 % cream Apply 1 application topically daily as needed (for rosacea).   lisinopril-hydrochlorothiazide (PRINZIDE,ZESTORETIC) 20-25 MG tablet Take 1 tablet by mouth daily.    Magnesium 250 MG TABS Take 250 mg by mouth 2 (two) times daily.   mometasone (ELOCON) 0.1 % ointment Apply 1 application topically daily as needed.   Multiple Vitamin (MULTIVITAMIN) tablet Take 1 tablet by mouth daily.   nitroGLYCERIN (NITROSTAT) 0.4 MG SL tablet Place 1 tablet (0.4 mg total) under the tongue every 5 (five) minutes as needed for chest pain.   silodosin (RAPAFLO) 8 MG CAPS capsule Take 8 mg by mouth daily as needed.     Allergies:   Ciprofloxacin, Tamsulosin, Chloraprep one step [chlorhexidine gluconate], and Sulfamethoxazole   Social History   Tobacco Use   Smoking status: Former    Types: Pipe    Quit date: 02/28/2000    Years since quitting: 21.2  Smokeless tobacco: Never  Vaping Use   Vaping Use: Never used  Substance Use Topics   Alcohol use: Yes    Comment: 05/23/2018 "nothing since ~ 10/2017"   Drug use: Never     Family History: The patient's family history includes Hypertension in his father.  ROS:   Please see the history of present illness.    All other systems reviewed and are negative.  EKGs/Labs/Other Studies Reviewed:    The following studies were reviewed today:  EKG: sinus brady rate 51 bpm, nonspec T wave  abnormal  Prior: Sinus bradycardia rate 50, T wave flattening in lead III  Recent Labs: No results found for requested labs within last 8760 hours.  Recent Lipid Panel    Component Value Date/Time   CHOL 137 05/24/2018 0716   TRIG 61 05/24/2018 0716   HDL 40 (L) 05/24/2018 0716   CHOLHDL 3.4 05/24/2018 0716   VLDL 12 05/24/2018 0716   LDLCALC 85 05/24/2018 0716    Physical Exam:    VS:  BP (!) 116/58 (BP Location: Left Arm, Patient Position: Sitting, Cuff Size: Normal)   Pulse 65   Ht 5\' 10"  (1.778 m)   Wt 170 lb (77.1 kg)   SpO2 97%   BMI 24.39 kg/m     Wt Readings from Last 5 Encounters:  05/12/21 170 lb (77.1 kg)  03/30/21 172 lb (78 kg)  07/25/20 170 lb 6.4 oz (77.3 kg)  01/19/20 171 lb 12.8 oz (77.9 kg)  07/15/19 170 lb (77.1 kg)    Constitutional: No acute distress Eyes: sclera non-icteric, normal conjunctiva and lids ENMT: normal dentition, moist mucous membranes Cardiovascular: regular rhythm, normal rate, 1/6 SEM. S1 and S2 normal. Radial pulses normal bilaterally. No jugular venous distention.  Respiratory: clear to auscultation bilaterally GI : normal bowel sounds, soft and nontender. No distention.   MSK: extremities warm, well perfused. No edema.  NEURO: grossly nonfocal exam, moves all extremities. PSYCH: alert and oriented x 3, normal mood and affect.    ASSESSMENT:    1. Dizziness   2. Essential hypertension   3. Medication management   4. Postural dizziness with presyncope   5. Coronary artery disease involving native coronary artery of native heart without angina pectoris   6. Non-ST elevation (NSTEMI) myocardial infarction ([place])   7. Hyperlipidemia, unspecified hyperlipidemia type     PLAN:    Dizziness Presyncope Med management - feels much better after stopping metoprolol - normal echo - recommend compression socks.   NSTEMI -He has had PCI to the RCA and has residual LAD disease that we are medically managing.  No significant  chest pain -Continue aspirin 81 mg daily, atorvastatin 80 mg daily, stop BB as above.   Hypertension-continue lisinopril HCTZ 20-25 mg.  Blood pressure is stable today. If recurrent symptoms consider reducing dose of anti HTN therapy.  Hyperlipidemia-continue atorvastatin 80 mg daily, lipids are stable.  Total time of encounter: 30 minutes total time of encounter, including 20 minutes spent in face-to-face patient care on the date of this encounter. This time includes coordination of care and counseling regarding above mentioned problem list. Remainder of non-face-to-face time involved reviewing chart documents/testing relevant to the patient encounter and documentation in the medical record. I have independently reviewed documentation from referring provider.   Cherlynn Kaiser, MD, [facility]     Medication Adjustments/Labs and Tests Ordered: Current medicines are reviewed at length with the patient today.  Concerns regarding medicines are outlined  above.   No orders of the defined types were placed in this encounter.   No orders of the defined types were placed in this encounter.   Patient Instructions  Medication Instructions:  No Changes In Medications at this time.  *If you need a refill on your cardiac medications before your next appointment, please call your pharmacy*  Follow-Up: At [facility], you and your health needs are our priority.  As part of our continuing mission to provide you with exceptional heart care, we have created designated Provider Care Teams.  These Care Teams include your primary Cardiologist (physician) and Advanced Practice Providers (APPs -  Physician Assistants and Nurse Practitioners) who all work together to provide you with the care you need, when you need it.  Your next appointment:   MARCH 30th at 4:20PM  The format for your next appointment:   In Person  Provider:   Cherlynn Kaiser, MD

## 2021-05-12 NOTE — Patient Instructions (Signed)
Medication Instructions:  No Changes In Medications at this time.  *If you need a refill on your cardiac medications before your next appointment, please call your pharmacy*  Follow-Up: At [facility], you and your health needs are our priority.  As part of our continuing mission to provide you with exceptional heart care, we have created designated Provider Care Teams.  These Care Teams include your primary Cardiologist (physician) and Advanced Practice Providers (APPs -  Physician Assistants and Nurse Practitioners) who all work together to provide you with the care you need, when you need it.  Your next appointment:   MARCH 30th at 4:20PM  The format for your next appointment:   In Person  Provider:   Cherlynn Kaiser, MD

## 2021-07-27 ENCOUNTER — Encounter: Payer: Self-pay | Admitting: Internal Medicine

## 2021-07-28 ENCOUNTER — Ambulatory Visit: Payer: Medicare Other | Admitting: Internal Medicine

## 2021-08-24 DIAGNOSIS — M79671 Pain in right foot: Secondary | ICD-10-CM | POA: Diagnosis not present

## 2021-08-24 DIAGNOSIS — D2371 Other benign neoplasm of skin of right lower limb, including hip: Secondary | ICD-10-CM | POA: Diagnosis not present

## 2021-08-30 DIAGNOSIS — Z85828 Personal history of other malignant neoplasm of skin: Secondary | ICD-10-CM | POA: Diagnosis not present

## 2021-08-30 DIAGNOSIS — I10 Essential (primary) hypertension: Secondary | ICD-10-CM | POA: Diagnosis not present

## 2021-08-30 DIAGNOSIS — E7800 Pure hypercholesterolemia, unspecified: Secondary | ICD-10-CM | POA: Diagnosis not present

## 2021-08-30 DIAGNOSIS — L719 Rosacea, unspecified: Secondary | ICD-10-CM | POA: Diagnosis not present

## 2021-08-30 DIAGNOSIS — Z0000 Encounter for general adult medical examination without abnormal findings: Secondary | ICD-10-CM | POA: Diagnosis not present

## 2021-11-09 DIAGNOSIS — H6123 Impacted cerumen, bilateral: Secondary | ICD-10-CM | POA: Diagnosis not present

## 2021-11-16 ENCOUNTER — Ambulatory Visit (INDEPENDENT_AMBULATORY_CARE_PROVIDER_SITE_OTHER): Payer: Medicare Other | Admitting: Internal Medicine

## 2021-11-16 ENCOUNTER — Encounter: Payer: Self-pay | Admitting: Internal Medicine

## 2021-11-16 VITALS — BP 138/66 | HR 58 | Ht 70.0 in | Wt 171.2 lb

## 2021-11-16 DIAGNOSIS — I2510 Atherosclerotic heart disease of native coronary artery without angina pectoris: Secondary | ICD-10-CM

## 2021-11-16 DIAGNOSIS — E785 Hyperlipidemia, unspecified: Secondary | ICD-10-CM

## 2021-11-16 DIAGNOSIS — R55 Syncope and collapse: Secondary | ICD-10-CM

## 2021-11-16 DIAGNOSIS — I214 Non-ST elevation (NSTEMI) myocardial infarction: Secondary | ICD-10-CM | POA: Diagnosis not present

## 2021-11-16 DIAGNOSIS — Z79899 Other long term (current) drug therapy: Secondary | ICD-10-CM | POA: Diagnosis not present

## 2021-11-16 DIAGNOSIS — I358 Other nonrheumatic aortic valve disorders: Secondary | ICD-10-CM | POA: Diagnosis not present

## 2021-11-16 DIAGNOSIS — I10 Essential (primary) hypertension: Secondary | ICD-10-CM | POA: Diagnosis not present

## 2021-11-16 DIAGNOSIS — R42 Dizziness and giddiness: Secondary | ICD-10-CM | POA: Diagnosis not present

## 2021-11-16 MED ORDER — AMLODIPINE BESYLATE 5 MG PO TABS: 5.0000 mg | ORAL_TABLET | Freq: Every day | ORAL | 3 refills | Status: DC

## 2021-11-16 NOTE — Progress Notes (Signed)
?Cardiology Office Note:   ? ?Date:  11/16/2021  ? ?ID:  Jerry [name], DOB 07-Nov-1950, MRN [id number] ? ?PCP:  Alroy Dust, L.Marlou Sa, MD  ?Cardiologist:  Elouise Munroe, MD  ?Electrophysiologist:  None  ? ?Referring MD: Alroy Dust, L.Marlou Sa, MD  ? ?Chief Complaint/Reason for Referral: ?CAD status post NSTEMI with PCI to the RCA and residual mid LAD disease medically managed ? ?History of Present Illness:   ? ?Jerry [name] is a 71 y.o. male with history of hypertension, hyperlipidemia, migraine headaches previously on verapamil, who presents today for follow-up of NSTEMI with PCI to the RCA and evidence of residual complex LAD disease on 05/23/2018.  ? ?Feeling much better off of metoprolol. Only occasional positional dizziness. BP mildly elevated after stopping metoprolol. ? ?The patient denies chest pain, chest pressure, dyspnea at rest or with exertion, palpitations, PND, orthopnea, or leg swelling. Denies cough, fever, chills. Denies nausea, vomiting. Denies syncope. Denies snoring. ? ?Past Medical History:  ?Diagnosis Date  ? High cholesterol   ? History of kidney stones   ? Hypertension   ? Migraine   ? "none since ~ 2014 or before" (05/23/2018)  ? ? ?Past Surgical History:  ?Procedure Laterality Date  ? CORONARY STENT INTERVENTION N/A 05/26/2018  ? Procedure: CORONARY STENT INTERVENTION;  Surgeon: Martinique, Peter M, MD;  Location: [place] [facility];  Service: Cardiovascular;  Laterality: N/A;  ? CYSTOSCOPY W/ STONE MANIPULATION  2017  ? HERNIA REPAIR    ? INGUINAL HERNIA REPAIR Left 03/07/2016  ? Procedure: LEFT INGUINAL HERNIA REPAIR;  Surgeon: Donnie Mesa, MD;  Location: [place];  Service: General;  Laterality: Left;  ? INSERTION OF MESH Left 03/07/2016  ? Procedure: INSERTION OF MESH;  Surgeon: Donnie Mesa, MD;  Location: [place];  Service: General;  Laterality: Left;  ? LEFT HEART CATH AND CORONARY ANGIOGRAPHY N/A 05/26/2018  ? Procedure: LEFT HEART CATH AND CORONARY ANGIOGRAPHY;  Surgeon: Martinique, Peter M, MD;   Location: [place] [facility];  Service: Cardiovascular;  Laterality: N/A;  ? LITHOTRIPSY  1990s  ? ? ?Current Medications: ?Current Meds  ?Medication Sig  ? acetaminophen (TYLENOL) 650 MG CR tablet Take 650 mg by mouth every 8 (eight) hours as needed for pain.  ? amLODipine (NORVASC) 5 MG tablet Take 1 tablet (5 mg total) by mouth daily.  ? aspirin EC 81 MG tablet Take 1 tablet by mouth daily.  ? atorvastatin (LIPITOR) 80 MG tablet Take 1 tablet (80 mg total) by mouth daily at 6 PM.  ? fluticasone (FLONASE) 50 MCG/ACT nasal spray Place 2 sprays into both nostrils daily as needed for allergies.  ? ketoconazole (NIZORAL) 2 % cream Apply 1 application topically daily as needed (for rosacea).  ? lisinopril-hydrochlorothiazide (PRINZIDE,ZESTORETIC) 20-25 MG tablet Take 1 tablet by mouth daily.   ? Magnesium 250 MG TABS Take 250 mg by mouth 2 (two) times daily.  ? mometasone (ELOCON) 0.1 % ointment Apply 1 application topically daily as needed.  ? Multiple Vitamin (MULTIVITAMIN) tablet Take 1 tablet by mouth daily.  ? silodosin (RAPAFLO) 8 MG CAPS capsule Take 8 mg by mouth daily as needed.  ?  ? ?Allergies:   Ciprofloxacin, Tamsulosin, Chloraprep one step [chlorhexidine gluconate], and Sulfamethoxazole  ? ?Social History  ? ?Tobacco Use  ? Smoking status: Former  ?  Types: Pipe  ?  Quit date: 02/28/2000  ?  Years since quitting: 21.7  ? Smokeless tobacco: Never  ?Vaping Use  ? Vaping Use: Never used  ?  Substance Use Topics  ? Alcohol use: Yes  ?  Comment: 05/23/2018 "nothing since ~ 10/2017"  ? Drug use: Never  ?  ? ?Family History: ?The patient's family history includes Hypertension in his father. ? ?ROS:   ?Please see the history of present illness.    ?All other systems reviewed and are negative. ? ?EKGs/Labs/Other Studies Reviewed:   ? ?The following studies were reviewed today: ? ?EKG: sinus brady rate 58 bpm, nonspec T wave abnormal ? ?Recent Labs: ?No results found for requested labs within last 8760 hours.  ?Recent  Lipid Panel ?   ?Component Value Date/Time  ? CHOL 137 05/24/2018 0716  ? TRIG 61 05/24/2018 0716  ? HDL 40 (L) 05/24/2018 0716  ? CHOLHDL 3.4 05/24/2018 0716  ? VLDL 12 05/24/2018 0716  ? [place] 85 05/24/2018 0716  ? ? ?Physical Exam:   ? ?VS:  BP 138/66   Pulse (!) 58   Ht '5\' 10"'$  (1.778 m)   Wt 171 lb 3.2 oz (77.7 kg)   SpO2 97%   BMI 24.56 kg/m?    ? ?Wt Readings from Last 5 Encounters:  ?11/16/21 171 lb 3.2 oz (77.7 kg)  ?05/12/21 170 lb (77.1 kg)  ?03/30/21 172 lb (78 kg)  ?07/25/20 170 lb 6.4 oz (77.3 kg)  ?01/19/20 171 lb 12.8 oz (77.9 kg)  ?  ?Constitutional: No acute distress ?Eyes: sclera non-icteric, normal conjunctiva and lids ?ENMT: normal dentition, moist mucous membranes ?Cardiovascular: regular rhythm, normal rate, 1/6 SEM. S1 and S2 normal. Radial pulses normal bilaterally. No jugular venous distention.  ?Respiratory: clear to auscultation bilaterally ?GI : normal bowel sounds, soft and nontender. No distention.   ?MSK: extremities warm, well perfused. No edema.  ?NEURO: grossly nonfocal exam, moves all extremities. ?PSYCH: alert and oriented x 3, normal mood and affect.  ? ? ?ASSESSMENT:   ? ?1. Essential hypertension   ?2. Dizziness   ?3. Postural dizziness with presyncope   ?4. Medication management   ?5. Coronary artery disease involving native coronary artery of native heart without angina pectoris   ?6. Non-ST elevation (NSTEMI) myocardial infarction [facility])   ?7. Hyperlipidemia, unspecified hyperlipidemia type   ?8. Aortic valve sclerosis   ? ? ? ?PLAN:   ? ?Dizziness ?Presyncope ?Med management ?- feels much better after stopping metoprolol ?- normal echo ?- recommend compression socks.  ? ?NSTEMI ?-He has had PCI to the RCA and has residual LAD disease that we are medically managing.  No chest pain. ?-Continue aspirin 81 mg daily, atorvastatin 80 mg daily. ? ?Hypertension-continue lisinopril HCTZ 20-25 mg.  Blood pressure is mildly elevated today and on home readings. Will add  amlodipine 5 mg daily and monitor symptoms for recurrence of dizziness or presyncope.  ? ?Hyperlipidemia-continue atorvastatin 80 mg daily. ? ?Total time of encounter: ?30 minutes total time of encounter, including 20 minutes spent in face-to-face patient care on the date of this encounter. This time includes coordination of care and counseling regarding above mentioned problem list. Remainder of non-face-to-face time involved reviewing chart documents/testing relevant to the patient encounter and documentation in the medical record. I have independently reviewed documentation from referring provider.  ? ?Cherlynn Kaiser, MD, [facility] ?[place]  ? ? ? ?Medication Adjustments/Labs and Tests Ordered: ?Current medicines are reviewed at length with the patient today.  Concerns regarding medicines are outlined above.  ? ?Orders Placed This Encounter  ?Procedures  ? EKG 12-Lead  ? ? ? ?Meds ordered this encounter  ?  Medications  ? amLODipine (NORVASC) 5 MG tablet  ?  Sig: Take 1 tablet (5 mg total) by mouth daily.  ?  Dispense:  90 tablet  ?  Refill:  3  ? ? ? ?Patient Instructions  ?Medication Instructions:  ?START: AMLODIPINE '5mg'$  ONCE DAILY  ?*If you need a refill on your cardiac medications before your next appointment, please call your pharmacy* ? ?Follow-Up: ?At [facility], you and your health needs are our priority.  As part of our continuing mission to provide you with exceptional heart care, we have created designated Provider Care Teams.  These Care Teams include your primary Cardiologist (physician) and Advanced Practice Providers (APPs -  Physician Assistants and Nurse Practitioners) who all work together to provide you with the care you need, when you need it. ? ?Your next appointment:   ?6 month(s) ? ?The format for your next appointment:   ?In Person ? ?Provider:   ?Elouise Munroe, MD     ? ?Please check your blood pressure at home daily, write it down.  Call the office or send message  via Mychart with the readings in 2 weeks for Dr. Margaretann Loveless to review.  ?  ? ?

## 2021-11-16 NOTE — Patient Instructions (Signed)
Medication Instructions:  ?START: AMLODIPINE '5mg'$  ONCE DAILY  ?*If you need a refill on your cardiac medications before your next appointment, please call your pharmacy* ? ?Follow-Up: ?At [facility], you and your health needs are our priority.  As part of our continuing mission to provide you with exceptional heart care, we have created designated Provider Care Teams.  These Care Teams include your primary Cardiologist (physician) and Advanced Practice Providers (APPs -  Physician Assistants and Nurse Practitioners) who all work together to provide you with the care you need, when you need it. ? ?Your next appointment:   ?6 month(s) ? ?The format for your next appointment:   ?In Person ? ?Provider:   ?Elouise Munroe, MD     ? ?Please check your blood pressure at home daily, write it down.  Call the office or send message via Mychart with the readings in 2 weeks for Dr. Margaretann Loveless to review.  ? ?

## 2022-01-09 DIAGNOSIS — M79672 Pain in left foot: Secondary | ICD-10-CM | POA: Diagnosis not present

## 2022-01-09 DIAGNOSIS — D2372 Other benign neoplasm of skin of left lower limb, including hip: Secondary | ICD-10-CM | POA: Diagnosis not present

## 2022-01-16 ENCOUNTER — Telehealth: Payer: Self-pay | Admitting: Internal Medicine

## 2022-01-16 ENCOUNTER — Other Ambulatory Visit: Payer: Self-pay

## 2022-01-16 ENCOUNTER — Ambulatory Visit (HOSPITAL_COMMUNITY)
Admission: EM | Disposition: A | Discharge status: Home / Self Care | Payer: Self-pay | Source: Home / Self Care | Attending: Emergency Medicine

## 2022-01-16 ENCOUNTER — Inpatient Hospital Stay (HOSPITAL_COMMUNITY): Payer: Medicare Other

## 2022-01-16 ENCOUNTER — Observation Stay (HOSPITAL_COMMUNITY)
Admission: EM | Admit: 2022-01-16 | Discharge: 2022-01-17 | Disposition: A | Discharge status: Home / Self Care | Payer: Medicare Other | Attending: Interventional Cardiology | Admitting: Interventional Cardiology

## 2022-01-16 ENCOUNTER — Emergency Department (HOSPITAL_COMMUNITY): Payer: Medicare Other

## 2022-01-16 DIAGNOSIS — I252 Old myocardial infarction: Secondary | ICD-10-CM | POA: Insufficient documentation

## 2022-01-16 DIAGNOSIS — Z7982 Long term (current) use of aspirin: Secondary | ICD-10-CM | POA: Diagnosis not present

## 2022-01-16 DIAGNOSIS — Z79899 Other long term (current) drug therapy: Secondary | ICD-10-CM | POA: Insufficient documentation

## 2022-01-16 DIAGNOSIS — Z87891 Personal history of nicotine dependence: Secondary | ICD-10-CM | POA: Insufficient documentation

## 2022-01-16 DIAGNOSIS — I10 Essential (primary) hypertension: Secondary | ICD-10-CM | POA: Diagnosis not present

## 2022-01-16 DIAGNOSIS — I213 ST elevation (STEMI) myocardial infarction of unspecified site: Secondary | ICD-10-CM | POA: Diagnosis not present

## 2022-01-16 DIAGNOSIS — Z955 Presence of coronary angioplasty implant and graft: Secondary | ICD-10-CM | POA: Insufficient documentation

## 2022-01-16 DIAGNOSIS — E785 Hyperlipidemia, unspecified: Secondary | ICD-10-CM | POA: Diagnosis not present

## 2022-01-16 DIAGNOSIS — R079 Chest pain, unspecified: Secondary | ICD-10-CM | POA: Diagnosis present

## 2022-01-16 DIAGNOSIS — I2510 Atherosclerotic heart disease of native coronary artery without angina pectoris: Secondary | ICD-10-CM | POA: Diagnosis not present

## 2022-01-16 DIAGNOSIS — R0789 Other chest pain: Secondary | ICD-10-CM | POA: Diagnosis present

## 2022-01-16 DIAGNOSIS — R918 Other nonspecific abnormal finding of lung field: Secondary | ICD-10-CM | POA: Diagnosis not present

## 2022-01-16 DIAGNOSIS — R9431 Abnormal electrocardiogram [ECG] [EKG]: Secondary | ICD-10-CM | POA: Diagnosis present

## 2022-01-16 HISTORY — PX: LEFT HEART CATH AND CORONARY ANGIOGRAPHY: CATH118249

## 2022-01-16 LAB — BASIC METABOLIC PANEL
Anion gap: 8 (ref 5–15)
BUN: 23 mg/dL (ref 8–23)
CO2: 25 mmol/L (ref 22–32)
Calcium: 9.3 mg/dL (ref 8.9–10.3)
Chloride: 104 mmol/L (ref 98–111)
Creatinine, Ser: 1.07 mg/dL (ref 0.61–1.24)
GFR, Estimated: >60 mL/min (ref >60)
Glucose, Bld: 98 mg/dL (ref 70–99)
Potassium: 3.5 mmol/L (ref 3.5–5.1)
Sodium: 137 mmol/L (ref 135–145)

## 2022-01-16 LAB — LIPID PANEL
Cholesterol: 126 mg/dL (ref 0–200)
HDL: 39 mg/dL — ABNORMAL LOW (ref >40)
LDL Cholesterol: 65 mg/dL (ref 0–99)
Total CHOL/HDL Ratio: 3.2 RATIO
Triglycerides: 112 mg/dL (ref <150)
VLDL: 22 mg/dL (ref 0–40)

## 2022-01-16 LAB — POCT I-STAT, CHEM 8
BUN: 24 mg/dL — ABNORMAL HIGH (ref 8–23)
Calcium, Ion: 1.2 mmol/L (ref 1.15–1.40)
Chloride: 104 mmol/L (ref 98–111)
Creatinine, Ser: 1 mg/dL (ref 0.61–1.24)
Glucose, Bld: 104 mg/dL — ABNORMAL HIGH (ref 70–99)
HCT: 41 % (ref 39.0–52.0)
Hemoglobin: 13.9 g/dL (ref 13.0–17.0)
Potassium: 3.6 mmol/L (ref 3.5–5.1)
Sodium: 141 mmol/L (ref 135–145)
TCO2: 24 mmol/L (ref 22–32)

## 2022-01-16 LAB — CBC
HCT: 46.3 % (ref 39.0–52.0)
Hemoglobin: 15.1 g/dL (ref 13.0–17.0)
MCH: 29.5 pg (ref 26.0–34.0)
MCHC: 32.6 g/dL (ref 30.0–36.0)
MCV: 90.4 fL (ref 80.0–100.0)
Platelets: 190 10*3/uL (ref 150–400)
RBC: 5.12 MIL/uL (ref 4.22–5.81)
RDW: 13.1 % (ref 11.5–15.5)
WBC: 6.4 10*3/uL (ref 4.0–10.5)
nRBC: 0 % (ref 0.0–0.2)

## 2022-01-16 LAB — PROTIME-INR
INR: 1 (ref 0.8–1.2)
Prothrombin Time: 13.1 seconds (ref 11.4–15.2)

## 2022-01-16 LAB — TROPONIN I (HIGH SENSITIVITY)
Troponin I (High Sensitivity): 4 ng/L (ref <18)
Troponin I (High Sensitivity): 14 ng/L (ref <18)

## 2022-01-16 LAB — HEMOGLOBIN A1C
Hgb A1c MFr Bld: 6 % — ABNORMAL HIGH (ref 4.8–5.6)
Mean Plasma Glucose: 125.5 mg/dL

## 2022-01-16 LAB — APTT: aPTT: 28 seconds (ref 24–36)

## 2022-01-16 LAB — POCT ACTIVATED CLOTTING TIME: Activated Clotting Time: 275 seconds

## 2022-01-16 SURGERY — LEFT HEART CATH AND CORONARY ANGIOGRAPHY
Anesthesia: LOCAL

## 2022-01-16 MED ORDER — MAGNESIUM GLUCONATE 500 MG PO TABS
250.0000 mg | ORAL_TABLET | Freq: Two times a day (BID) | ORAL | Status: DC
  Administered 2022-01-16: 250 mg via ORAL
  Filled 2022-01-16 (×3): qty 1

## 2022-01-16 MED ORDER — LISINOPRIL 20 MG PO TABS
20.0000 mg | ORAL_TABLET | Freq: Every day | ORAL | Status: DC
  Filled 2022-01-16: qty 1

## 2022-01-16 MED ORDER — FLUTICASONE PROPIONATE 50 MCG/ACT NA SUSP: 2.0000 | Freq: Every day | NASAL | Status: DC | PRN

## 2022-01-16 MED ORDER — HYDROCHLOROTHIAZIDE 25 MG PO TABS
25.0000 mg | ORAL_TABLET | Freq: Every day | ORAL | Status: DC
  Filled 2022-01-16: qty 1

## 2022-01-16 MED ORDER — VERAPAMIL HCL 2.5 MG/ML IV SOLN
INTRAVENOUS | Status: AC
  Filled 2022-01-16: qty 2

## 2022-01-16 MED ORDER — MIDAZOLAM HCL 2 MG/2ML IJ SOLN
INTRAMUSCULAR | Status: DC | PRN
  Administered 2022-01-16: 1 mg via INTRAVENOUS

## 2022-01-16 MED ORDER — HEPARIN (PORCINE) IN NACL 1000-0.9 UT/500ML-% IV SOLN
INTRAVENOUS | Status: DC | PRN
  Administered 2022-01-16 (×2): 500 mL

## 2022-01-16 MED ORDER — HEPARIN SODIUM (PORCINE) 5000 UNIT/ML IJ SOLN
4000.0000 [IU] | Freq: Once | INTRAMUSCULAR | Status: AC
  Administered 2022-01-16: 4000 [IU] via INTRAVENOUS
  Filled 2022-01-16: qty 1

## 2022-01-16 MED ORDER — ASPIRIN 81 MG PO TBEC
81.0000 mg | DELAYED_RELEASE_TABLET | Freq: Every day | ORAL | Status: DC
  Filled 2022-01-16: qty 1

## 2022-01-16 MED ORDER — ASPIRIN 81 MG PO CHEW
324.0000 mg | CHEWABLE_TABLET | Freq: Once | ORAL | Status: AC
  Administered 2022-01-16: 324 mg via ORAL
  Filled 2022-01-16: qty 4

## 2022-01-16 MED ORDER — HEPARIN (PORCINE) IN NACL 1000-0.9 UT/500ML-% IV SOLN
INTRAVENOUS | Status: AC
  Filled 2022-01-16: qty 1000

## 2022-01-16 MED ORDER — SODIUM CHLORIDE 0.9 % IV SOLN: 250.0000 mL | INTRAVENOUS | Status: DC | PRN

## 2022-01-16 MED ORDER — ADULT MULTIVITAMIN W/MINERALS CH
1.0000 | ORAL_TABLET | Freq: Every day | ORAL | Status: DC
  Filled 2022-01-16: qty 1

## 2022-01-16 MED ORDER — SODIUM CHLORIDE 0.9% FLUSH
3.0000 mL | Freq: Two times a day (BID) | INTRAVENOUS | Status: DC
  Administered 2022-01-16: 3 mL via INTRAVENOUS

## 2022-01-16 MED ORDER — LIDOCAINE HCL (PF) 1 % IJ SOLN
INTRAMUSCULAR | Status: AC
  Filled 2022-01-16: qty 30

## 2022-01-16 MED ORDER — LABETALOL HCL 5 MG/ML IV SOLN: 10.0000 mg | INTRAVENOUS | Status: AC | PRN

## 2022-01-16 MED ORDER — AMLODIPINE BESYLATE 2.5 MG PO TABS
2.5000 mg | ORAL_TABLET | Freq: Every day | ORAL | Status: DC
  Filled 2022-01-16: qty 1

## 2022-01-16 MED ORDER — HEPARIN SODIUM (PORCINE) 1000 UNIT/ML IJ SOLN
INTRAMUSCULAR | Status: AC
  Filled 2022-01-16: qty 10

## 2022-01-16 MED ORDER — LIDOCAINE HCL (PF) 1 % IJ SOLN
INTRAMUSCULAR | Status: DC | PRN
  Administered 2022-01-16: 2 mL

## 2022-01-16 MED ORDER — LISINOPRIL-HYDROCHLOROTHIAZIDE 20-25 MG PO TABS: 1.0000 | ORAL_TABLET | Freq: Every day | ORAL | Status: DC

## 2022-01-16 MED ORDER — ONDANSETRON HCL 4 MG/2ML IJ SOLN: 4.0000 mg | Freq: Four times a day (QID) | INTRAMUSCULAR | Status: DC | PRN

## 2022-01-16 MED ORDER — HEPARIN SODIUM (PORCINE) 1000 UNIT/ML IJ SOLN
INTRAMUSCULAR | Status: DC | PRN
  Administered 2022-01-16: 4000 [IU] via INTRAVENOUS

## 2022-01-16 MED ORDER — VERAPAMIL HCL 2.5 MG/ML IV SOLN
INTRAVENOUS | Status: DC | PRN
  Administered 2022-01-16 (×2): 10 mL via INTRA_ARTERIAL

## 2022-01-16 MED ORDER — MIDAZOLAM HCL 2 MG/2ML IJ SOLN
INTRAMUSCULAR | Status: AC
  Filled 2022-01-16: qty 2

## 2022-01-16 MED ORDER — FENTANYL CITRATE (PF) 100 MCG/2ML IJ SOLN
INTRAMUSCULAR | Status: DC | PRN
  Administered 2022-01-16: 25 ug via INTRAVENOUS

## 2022-01-16 MED ORDER — AMLODIPINE BESYLATE 5 MG PO TABS: 5.0000 mg | ORAL_TABLET | Freq: Every day | ORAL | Status: DC

## 2022-01-16 MED ORDER — SODIUM CHLORIDE 0.9% FLUSH: 3.0000 mL | INTRAVENOUS | Status: DC | PRN

## 2022-01-16 MED ORDER — ACETAMINOPHEN 325 MG PO TABS: 650.0000 mg | ORAL_TABLET | ORAL | Status: DC | PRN

## 2022-01-16 MED ORDER — HYDRALAZINE HCL 20 MG/ML IJ SOLN: 10.0000 mg | INTRAMUSCULAR | Status: AC | PRN

## 2022-01-16 MED ORDER — SODIUM CHLORIDE 0.9 % IV SOLN: INTRAVENOUS | Status: AC

## 2022-01-16 MED ORDER — NITROGLYCERIN 1 MG/10 ML FOR IR/CATH LAB
INTRA_ARTERIAL | Status: AC
  Filled 2022-01-16: qty 10

## 2022-01-16 MED ORDER — SODIUM CHLORIDE 0.9 % IV SOLN: INTRAVENOUS | Status: DC

## 2022-01-16 MED ORDER — IOHEXOL 350 MG/ML SOLN
INTRAVENOUS | Status: DC | PRN
  Administered 2022-01-16: 45 mL via INTRA_ARTERIAL

## 2022-01-16 MED ORDER — FENTANYL CITRATE (PF) 100 MCG/2ML IJ SOLN
INTRAMUSCULAR | Status: AC
  Filled 2022-01-16: qty 2

## 2022-01-16 SURGICAL SUPPLY — 13 items
BAND CMPR LRG ZPHR (HEMOSTASIS) ×1
BAND ZEPHYR COMPRESS 30 LONG (HEMOSTASIS) ×1 IMPLANT
CATH 5FR JL3.5 JR4 ANG PIG MP (CATHETERS) ×1 IMPLANT
GLIDESHEATH SLEND SS 6F .021 (SHEATH) ×1 IMPLANT
GUIDEWIRE INQWIRE 1.5J.035X260 (WIRE) IMPLANT
INQWIRE 1.5J .035X260CM (WIRE) ×4
KIT ENCORE 26 ADVANTAGE (KITS) ×1 IMPLANT
KIT HEART LEFT (KITS) ×3 IMPLANT
KIT HEMO VALVE WATCHDOG (MISCELLANEOUS) ×1 IMPLANT
PACK CARDIAC CATHETERIZATION (CUSTOM PROCEDURE TRAY) ×3 IMPLANT
SYR MEDRAD MARK 7 150ML (SYRINGE) ×3 IMPLANT
TRANSDUCER W/STOPCOCK (MISCELLANEOUS) ×3 IMPLANT
TUBING CIL FLEX 10 FLL-RA (TUBING) ×3 IMPLANT

## 2022-01-16 NOTE — ED Triage Notes (Signed)
Pt. Stated, Ive had chest pain since Saturday , Sunday , Monday, and today , Ive taken several Nitro throughout since Saturday. I took my last Nitro at 7. The pain will leave intermittent. My left arm has hurt some but not bad.

## 2022-01-16 NOTE — H&P (Addendum)
Cardiology Admission History and Physical:   Patient ID: Jerry [name] MRN: [id number]; DOB: Oct 06, 1950   Admission date: 01/16/2022  PCP:  Alroy Dust, L.Marlou Sa, MD   [place] Providers Cardiologist:  Elouise Munroe, MD     Chief Complaint:  Chest pain/STEMI  Patient Profile:   Jerry [name] is a 71 y.o. male with HLD, HTN, NSTEMI s/p PCI of the RCA (medically managed disease in the LAD) who is being seen 01/16/2022 for the evaluation of chest pain/STEMI.  History of Present Illness:   Jerry [name] is a 71 yo male with PMH noted above. He is followed by cardiology as an outpatient, Dr. Margaretann Loveless. Presented with NSTEMI in 05/2018 with cath showing severe disease of the RCA s/p PCI/DES and residual disease in the LAD and D2 treated medically. Placed on DAPT with ASA/Brilinta along with atorvastatin and lisinopril/HCTZ. Echo at that time showed LVEF of 60-65% with no rWMA. He was treated with metoprolol but developed dizziness and this was stopped.   He was last seen in the office on 11/16/21 and reported feeling better since stopping metoprolol, dizziness improved. Blood pressures were mildly elevated on BP logs therefore he was continued on lisinopril/HCTZ with amlodipine '5mg'$  daily added. Continued on ASA and statin.   Presented to the ED on 5/30 with complaints of chest pain over the past several days. Reported he had taken several SL nitro. Had called into the office and advised to come to the ED. EKG in the ED was concerning for slight lateral ST elevation. This was reviewed by Dr. Irish Lack and decision made to call CODE STEMI. Brought to the cath lab for emergent cardiac cath.    Past Medical History:  Diagnosis Date   High cholesterol    History of kidney stones    Hypertension    Migraine    "none since ~ 2014 or before" (05/23/2018)    Past Surgical History:  Procedure Laterality Date   CORONARY STENT INTERVENTION N/A 05/26/2018   Procedure: CORONARY STENT  INTERVENTION;  Surgeon: Martinique, Peter M, MD;  Location: [facility];  Service: Cardiovascular;  Laterality: N/A;   CYSTOSCOPY W/ STONE MANIPULATION  2017   HERNIA REPAIR     INGUINAL HERNIA REPAIR Left 03/07/2016   Procedure: LEFT INGUINAL HERNIA REPAIR;  Surgeon: Donnie Mesa, MD;  Location: [facility];  Service: General;  Laterality: Left;   INSERTION OF MESH Left 03/07/2016   Procedure: INSERTION OF MESH;  Surgeon: Donnie Mesa, MD;  Location: [facility];  Service: General;  Laterality: Left;   LEFT HEART CATH AND CORONARY ANGIOGRAPHY N/A 05/26/2018   Procedure: LEFT HEART CATH AND CORONARY ANGIOGRAPHY;  Surgeon: Martinique, Peter M, MD;  Location: [facility];  Service: Cardiovascular;  Laterality: N/A;   LITHOTRIPSY  1990s     Medications Prior to Admission: Prior to Admission medications   Medication Sig Start Date End Date Taking? Authorizing Provider  acetaminophen (TYLENOL) 650 MG CR tablet Take 650 mg by mouth every 8 (eight) hours as needed for pain.    [provider]  amLODipine (NORVASC) 5 MG tablet Take 1 tablet (5 mg total) by mouth daily. 11/16/21   Elouise Munroe, MD  aspirin EC 81 MG tablet Take 1 tablet by mouth daily.    [provider]  atorvastatin (LIPITOR) 80 MG tablet Take 1 tablet (80 mg total) by mouth daily at 6 PM. 05/27/18 11/16/21  Alma Friendly, MD  fluticasone (FLONASE) 50 MCG/ACT nasal spray  Place 2 sprays into both nostrils daily as needed for allergies.    [provider]  ketoconazole (NIZORAL) 2 % cream Apply 1 application topically daily as needed (for rosacea).    [provider]  lisinopril-hydrochlorothiazide (PRINZIDE,ZESTORETIC) 20-25 MG tablet Take 1 tablet by mouth daily.     [provider]  Magnesium 250 MG TABS Take 250 mg by mouth 2 (two) times daily.    [provider]  mometasone (ELOCON) 0.1 % ointment Apply 1 application topically daily as needed.    [provider]   Multiple Vitamin (MULTIVITAMIN) tablet Take 1 tablet by mouth daily.    [provider]  nitroGLYCERIN (NITROSTAT) 0.4 MG SL tablet Place 1 tablet (0.4 mg total) under the tongue every 5 (five) minutes as needed for chest pain. 07/15/19 05/12/21  Elouise Munroe, MD  silodosin (RAPAFLO) 8 MG CAPS capsule Take 8 mg by mouth daily as needed. 04/15/21   [provider]     Allergies:    Allergies  Allergen Reactions   Ciprofloxacin Other (See Comments)    Joint and muscle stiffness   Tamsulosin Other (See Comments)    Closed up nasal passages and couldn't breathe   Chloraprep One Step [Chlorhexidine Gluconate] Rash   Sulfamethoxazole Hives, Itching, Rash and Swelling    Social History:   Social History   Socioeconomic History   Marital status: Married    Spouse name: Not on file   Number of children: Not on file   Years of education: Not on file   Highest education level: Not on file  Occupational History   Not on file  Tobacco Use   Smoking status: Former    Types: Pipe    Quit date: 02/28/2000    Years since quitting: 21.8   Smokeless tobacco: Never  Vaping Use   Vaping Use: Never used  Substance and Sexual Activity   Alcohol use: Yes    Comment: 05/23/2018 "nothing since ~ 10/2017"   Drug use: Never   Sexual activity: Not on file  Other Topics Concern   Not on file  Social History Narrative   Not on file   Social Determinants of Health   Financial Resource Strain: Not on file  Food Insecurity: Not on file  Transportation Needs: Not on file  Physical Activity: Not on file  Stress: Not on file  Social Connections: Not on file  Intimate Partner Violence: Not on file    Family History:   The patient's family history includes Hypertension in his father.    ROS:  Please see the history of present illness.  All other ROS reviewed and negative.     Physical Exam/Data:   Vitals:   01/16/22 1340 01/16/22 1355 01/16/22 1410 01/16/22 1425  BP:  (!) 115/56 (!) 121/57 (!) 116/56 (!) 126/53  Pulse: (!) 45 (!) 42 (!) 47 (!) 45  Resp: '14 15 12 17  '$ Temp:      TempSrc:      SpO2: 100% 99% 98% 98%  Weight:      Height:       No intake or output data in the 24 hours ending 01/16/22 1455    01/16/2022    9:59 AM 11/16/2021    4:24 PM 05/12/2021    9:19 AM  Last 3 Weights  Weight (lbs) 170 lb 171 lb 3.2 oz 170 lb  Weight (kg) 77.111 kg 77.656 kg 77.111 kg     Body mass index is 24.39  kg/m.  General:  Well nourished, well developed, in no acute distress HEENT: normal Neck: no JVD Vascular: No carotid bruits; Distal pulses 2+ bilaterally   Cardiac:  normal S1, S2; RRR; no murmur  Lungs:  clear to auscultation bilaterally, no wheezing, rhonchi or rales  Abd: soft, nontender, no hepatomegaly  Ext: no edema Musculoskeletal:  No deformities, BUE and BLE strength normal and equal Skin: warm and dry  Neuro:  CNs 2-12 intact, no focal abnormalities noted Psych:  Normal affect    EKG:  The ECG that was done 5/30 was personally reviewed and demonstrates Sinus Rhythm 60bpm, slight lateral ST elevation  Relevant CV Studies:  Cath: 05/2018  Prox RCA to Mid RCA lesion is 99% stenosed. Prox LAD lesion is 50% stenosed. Mid LAD lesion is 70% stenosed. Mid LAD to Dist LAD lesion is 75% stenosed. Ost 2nd Diag lesion is 100% stenosed. LV end diastolic pressure is normal. Post intervention, there is a 0% residual stenosis. A drug-eluting stent was successfully placed using a STENT SYNERGY DES 2.5X20.   1. 2  Vessel obstructive CAD     - diffuse moderate mid and distal LAD disease. The vessel is calcified. There is a small second diagonal that is occluded.      - 99% proximal to mid RCA 2. Normal LVEDP 3. Successful PCI of the culprit lesion in the RCA with DES   Plan: DAPT for one year. Aggressive risk factor modification and medical management of the residual disease in the LAD. PCI of this vessel would require atherectomy and  extensive stenting.    Recommend uninterrupted dual antiplatelet therapy with Aspirin '81mg'$  daily and Ticagrelor '90mg'$  twice daily for a minimum of 12 months (ACS - Class I recommendation).  Diagnostic Dominance: Co-dominant Intervention    Echo: 04/2021  IMPRESSIONS     1. Left ventricular ejection fraction, by estimation, is 60 to 65%. The  left ventricle has normal function. The left ventricle has no regional  wall motion abnormalities. Left ventricular diastolic parameters are  consistent with Grade I diastolic  dysfunction (impaired relaxation).   2. Right ventricular systolic function is normal. The right ventricular  size is mildly enlarged. Tricuspid regurgitation signal is inadequate for  assessing PA pressure.   3. The mitral valve is normal in structure. Trivial mitral valve  regurgitation. No evidence of mitral stenosis.   4. The aortic valve is tricuspid. Aortic valve regurgitation is not  visualized. Mild to moderate aortic valve sclerosis/calcification is  present, without any evidence of aortic stenosis.   5. The inferior vena cava is normal in size with greater than 50%  respiratory variability, suggesting right atrial pressure of 3 mmHg.   FINDINGS   Left Ventricle: Left ventricular ejection fraction, by estimation, is 60  to 65%. The left ventricle has normal function. The left ventricle has no  regional wall motion abnormalities. Definity contrast agent was given IV  to delineate the left ventricular   endocardial borders. The left ventricular internal cavity size was normal  in size. There is no left ventricular hypertrophy. Left ventricular  diastolic parameters are consistent with Grade I diastolic dysfunction  (impaired relaxation). Normal left  ventricular filling pressure.   Right Ventricle: The right ventricular size is mildly enlarged. No  increase in right ventricular wall thickness. Right ventricular systolic  function is normal. Tricuspid  regurgitation signal is inadequate for  assessing PA pressure.   Left Atrium: Left atrial size was normal in size.   Right Atrium:  Right atrial size was normal in size.   Pericardium: There is no evidence of pericardial effusion.   Mitral Valve: The mitral valve is normal in structure. Trivial mitral  valve regurgitation. No evidence of mitral valve stenosis.   Tricuspid Valve: The tricuspid valve is normal in structure. Tricuspid  valve regurgitation is not demonstrated. No evidence of tricuspid  stenosis.   Aortic Valve: The aortic valve is tricuspid. Aortic valve regurgitation is  not visualized. Mild to moderate aortic valve sclerosis/calcification is  present, without any evidence of aortic stenosis. Aortic valve mean  gradient measures 7.2 mmHg. Aortic  valve peak gradient measures 14.5 mmHg. Aortic valve area, by VTI measures  2.87 cm.   Pulmonic Valve: The pulmonic valve was normal in structure. Pulmonic valve  regurgitation is not visualized. No evidence of pulmonic stenosis.   Aorta: The aortic root is normal in size and structure.   Venous: The inferior vena cava is normal in size with greater than 50%  respiratory variability, suggesting right atrial pressure of 3 mmHg.   IAS/Shunts: No atrial level shunt detected by color flow Doppler.   Laboratory Data:  High Sensitivity Troponin:   Recent Labs  Lab 01/16/22 1007  TROPONINIHS 4      Chemistry Recent Labs  Lab 01/16/22 1007  NA 137  K 3.5  CL 104  CO2 25  GLUCOSE 98  BUN 23  CREATININE 1.07  CALCIUM 9.3  GFRNONAA >60  ANIONGAP 8    No results for input(s): PROT, ALBUMIN, AST, ALT, ALKPHOS, BILITOT in the last 168 hours. Lipids  Recent Labs  Lab 01/16/22 1007  CHOL 126  TRIG 112  HDL 39*  LDLCALC 65  CHOLHDL 3.2   Hematology Recent Labs  Lab 01/16/22 1007  WBC 6.4  RBC 5.12  HGB 15.1  HCT 46.3  MCV 90.4  MCH 29.5  MCHC 32.6  RDW 13.1  PLT 190   Thyroid No results for  input(s): TSH, FREET4 in the last 168 hours. BNPNo results for input(s): BNP, PROBNP in the last 168 hours.  DDimer No results for input(s): DDIMER in the last 168 hours.   Assessment and Plan:   Jerry [name] is a 71 y.o. male with HLD, HTN, NSTEMI s/p PCI of the RCA (medically managed disease in the LAD) who is being seen 01/16/2022 for the evaluation of chest pain.  Chest pain/STEMI: intermittent chest pain over the past couple of days with use of SL nitro. EKG in the ED was concerning for lateral ST elevation in comparison with prior tracings. Given ASA and heparin in the ED -- brought to the cath lab for emergent cardiac cath  HLD: continue atorvastatin '80mg'$  daily   HTN: PTA meds lisinopril/HCTZ 20-25 and amlodipine '5mg'$  daily -- metoprolol stopped 2/2 dizziness    Risk Assessment/Risk Scores:   TIMI Risk Score for ST  Elevation MI:   The patient's TIMI risk score is 2, which indicates a 2.2% risk of all cause mortality at 30 days.{  Severity of Illness: The appropriate patient status for this patient is OBSERVATION. Observation status is judged to be reasonable and necessary in order to provide the required intensity of service to ensure the patient's safety. The patient's presenting symptoms, physical exam findings, and initial radiographic and laboratory data in the context of their medical condition is felt to place them at decreased risk for further clinical deterioration. Furthermore, it is anticipated that the patient will be medically stable for discharge from the hospital  within 2 midnights of admission.    For questions or updates, please contact [facility] Please consult [email or web address] for contact info under     Signed, Reino Bellis, NP  01/16/2022 2:55 PM   I have examined the patient and reviewed assessment and plan and discussed with patient.  Agree with above as stated.  I personally reviewed the ECG and made the decision for the patient to have emergent  cath.    Cath results done today showed: "Prox LAD lesion is 50% stenosed.   Mid LAD to Dist LAD lesion is 75% stenosed.   Ost 2nd Diag lesion is 100% stenosed.  Chronic total occlusion, noted in 2019 as well.   Mid LAD lesion is 50% stenosed.   Patent RCA stent.   The left ventricular systolic function is normal.   LV end diastolic pressure is normal.   The left ventricular ejection fraction is 55-65% by visual estimate.   There is no aortic valve stenosis.   Patent RCA stent.  TIMI-3 flow in the LAD.  No worsening of the anatomy from prior catheterization.  Mid LAD stenosis appears improved compared to 2019 cath.  Patient is currently pain-free.  Occluded second diagonal noted again.  Difficult to see any collateral filling.   Patient states that his blood pressure has been up and down.  Perhaps this affected collateral flow to the diagonal territory which caused ECG changes laterally.  No clear culprit lesion.  LAD disease would not explain ECG and appears unchanged.  TIMI-3 flow present.  No new lesions that would be target for PCI."  Continue medical therapy.   Larae Grooms

## 2022-01-16 NOTE — Telephone Encounter (Signed)
Pt c/o of Chest Pain: STAT if CP now or developed within 24 hours  1. Are you having CP right now? yes  2. Are you experiencing any other symptoms (ex. SOB, nausea, vomiting, sweating)?  Numbness in left arm, little tingling in his fingertip 3. How long have you been experiencing CP? Saturday night  4. Is your CP continuous or coming and going? Comes and goes, but having it all day   5. Have you taken Nitroglycerin? yes

## 2022-01-16 NOTE — Progress Notes (Signed)
TR BAND REMOVAL  LOCATION:    Right radial  DEFLATED PER PROTOCOL:    Yes.    TIME BAND OFF / DRESSING APPLIED:    1415pm A clean dry dressing applied with tegaderm and gauze, wrapped with coban   SITE UPON ARRIVAL:    Level 0  SITE AFTER BAND REMOVAL:    Level 0  CIRCULATION SENSATION AND MOVEMENT:    Within Normal Limits   Yes.    COMMENTS:   Care instructions given to patient.

## 2022-01-16 NOTE — Care Management Obs Status (Signed)
[facility] NOTIFICATION   Patient Details  Name: TIMUR NIBERT MRN: [id number] Date of Birth: 06-12-1951   Medicare Observation Status Notification Given:  Yes    Bethena Roys, RN 01/16/2022, 4:38 PM

## 2022-01-16 NOTE — Telephone Encounter (Signed)
-  Pt called reporting he is currently experiencing left sided chest pain. He denies dizziness, shortness of breath but report numbness in left arm. -Pt state he has took one nitroglycerin with no relief. -Nurse recommended pt take another nitro and report to ER for further evaluations.  -Pt verbalized understanding.

## 2022-01-16 NOTE — ED Provider Notes (Signed)
[facility] EMERGENCY DEPARTMENT Provider Note   CSN: [id number] Arrival date & time: 01/16/22  6256     History  Chief Complaint  Patient presents with   Chest Pain    Jerry [name] is a 71 y.o. male.  HPI 71 year old male with a history of CAD presents with chest pain.  Feels like he was having spasms.  Originally started 3 days ago and has been intermittent.  Nitro is typically making it go away.  However since around 8 AM after walking to the mailbox he developed recurrent pain in the left side of his chest as well as some numbness in the arm that did not go away despite 2 nitros.  It did significantly improve and he has only minimal discomfort now.  No diaphoresis or significant shortness of breath.  Does not feel as bad as when he had his heart attack in 2019.  Called a nurse who told him to come to the ER.  Home Medications Prior to Admission medications   Medication Sig Start Date End Date Taking? Authorizing Provider  acetaminophen (TYLENOL) 650 MG CR tablet Take 650 mg by mouth every 8 (eight) hours as needed for pain.    [provider]  amLODipine (NORVASC) 5 MG tablet Take 1 tablet (5 mg total) by mouth daily. 11/16/21   Elouise Munroe, MD  aspirin EC 81 MG tablet Take 1 tablet by mouth daily.    [provider]  atorvastatin (LIPITOR) 80 MG tablet Take 1 tablet (80 mg total) by mouth daily at 6 PM. 05/27/18 11/16/21  Alma Friendly, MD  fluticasone (FLONASE) 50 MCG/ACT nasal spray Place 2 sprays into both nostrils daily as needed for allergies.    [provider]  ketoconazole (NIZORAL) 2 % cream Apply 1 application topically daily as needed (for rosacea).    [provider]  lisinopril-hydrochlorothiazide (PRINZIDE,ZESTORETIC) 20-25 MG tablet Take 1 tablet by mouth daily.     [provider]  Magnesium 250 MG TABS Take 250 mg by mouth 2 (two) times daily.    [provider]  mometasone  (ELOCON) 0.1 % ointment Apply 1 application topically daily as needed.    [provider]  Multiple Vitamin (MULTIVITAMIN) tablet Take 1 tablet by mouth daily.    [provider]  nitroGLYCERIN (NITROSTAT) 0.4 MG SL tablet Place 1 tablet (0.4 mg total) under the tongue every 5 (five) minutes as needed for chest pain. 07/15/19 05/12/21  Elouise Munroe, MD  silodosin (RAPAFLO) 8 MG CAPS capsule Take 8 mg by mouth daily as needed. 04/15/21   [provider]      Allergies    Ciprofloxacin, Tamsulosin, Chloraprep one step [chlorhexidine gluconate], and Sulfamethoxazole    Review of Systems   Review of Systems  Constitutional:  Negative for diaphoresis.  Respiratory:  Negative for shortness of breath.   Cardiovascular:  Positive for chest pain.  Gastrointestinal:  Negative for abdominal pain.  Neurological:  Positive for numbness.   Physical Exam Updated Vital Signs BP (!) 99/54   Pulse 61   Temp 98.4 F (36.9 C) (Oral)   Resp 14   Ht '5\' 10"'$  (1.778 m)   Wt 77.1 kg   SpO2 97%   BMI 24.39 kg/m  Physical Exam Vitals and nursing note reviewed.  Constitutional:      General: He is not in acute distress.    Appearance: He is well-developed. He is not ill-appearing or diaphoretic.  HENT:  Head: Normocephalic and atraumatic.  Cardiovascular:     Rate and Rhythm: Normal rate and regular rhythm.     Heart sounds: Normal heart sounds.  Pulmonary:     Effort: Pulmonary effort is normal.     Breath sounds: Normal breath sounds.  Abdominal:     Palpations: Abdomen is soft.     Tenderness: There is no abdominal tenderness.  Musculoskeletal:     Right lower leg: No edema.     Left lower leg: No edema.  Skin:    General: Skin is warm and dry.  Neurological:     Mental Status: He is alert.    ED Results / Procedures / Treatments   Labs (all labs ordered are listed, but only abnormal results are displayed) Labs Reviewed  RESP PANEL BY RT-PCR (FLU A&B,  COVID) ARPGX2  BASIC METABOLIC PANEL  CBC  HEMOGLOBIN A1C  PROTIME-INR  APTT  LIPID PANEL  TROPONIN I (HIGH SENSITIVITY)    EKG EKG Interpretation  Date/Time:  Tuesday Jan 16 2022 09:55:12 EDT Ventricular Rate:  60 PR Interval:  124 QRS Duration: 84 QT Interval:  376 QTC Calculation: 376 R Axis:   14 Text Interpretation: Normal sinus rhythm lateral STEMI with reciprocal inferior changes new since May 27 2018 Confirmed by Sherwood Gambler [phone]) on 01/16/2022 10:16:44 AM  Radiology No results found.  Procedures .Critical Care Performed by: Sherwood Gambler, MD Authorized by: Sherwood Gambler, MD   Critical care provider statement:    Critical care time (minutes):  30   Critical care time was exclusive of:  Separately billable procedures and treating other patients   Critical care was necessary to treat or prevent imminent or life-threatening deterioration of the following conditions:  Cardiac failure   Critical care was time spent personally by me on the following activities:  Development of treatment plan with patient or surrogate, discussions with consultants, evaluation of patient's response to treatment, examination of patient, ordering and review of laboratory studies, ordering and review of radiographic studies, ordering and performing treatments and interventions, pulse oximetry, re-evaluation of patient's condition and review of old charts    Medications Ordered in ED Medications  0.9 %  sodium chloride infusion ( Intravenous New Bag/Given 01/16/22 1018)  aspirin chewable tablet 324 mg (324 mg Oral Given 01/16/22 1017)  heparin injection 4,000 Units (4,000 Units Intravenous Given 01/16/22 1016)    ED Course/ Medical Decision Making/ A&P                           Medical Decision Making Problems Addressed: Acute ST elevation myocardial infarction (STEMI), unspecified artery ([place]): acute illness or injury that poses a threat to life or bodily functions  Amount and/or  Complexity of Data Reviewed Independent Historian: spouse External Data Reviewed: radiology and notes. Labs: ordered. Radiology: ordered. ECG/medicine tests: ordered and independent interpretation performed.  Risk OTC drugs. Prescription drug management. Decision regarding hospitalization.   Patient presents with on and off chest pain, mostly exertional.  However he still having pain now.  ECG which was shown to me from triage is concerning for lateral ST elevation with subtle reciprocal changes in the inferior leads.  The ECG is overall subtle so I did quickly discussed with cardiology, Dr. Irish Lack, who agrees this looks concerning for STEMI and we will activate the Cath Lab.  He was given aspirin and heparin.  Initial blood pressure in triage was 99 but now is 604 systolic.  We  will hold on further nitro.  He will be taken emergently to the catheterization lab.        Final Clinical Impression(s) / ED Diagnoses Final diagnoses:  Acute ST elevation myocardial infarction (STEMI), unspecified artery [facility])    Rx / DC Orders ED Discharge Orders     None         Sherwood Gambler, MD 01/16/22 1035

## 2022-01-16 NOTE — Care Management CC44 (Signed)
Condition Code 44 Documentation Completed  Patient Details  Name: Jerry [name] MRN: [id number] Date of Birth: 1951/03/03   Condition Code 44 given:  Yes Patient signature on Condition Code 44 notice:  Yes Documentation of 2 MD's agreement:  Yes Code 44 added to claim:  Yes    Bethena Roys, RN 01/16/2022, 4:38 PM

## 2022-01-17 ENCOUNTER — Encounter (HOSPITAL_COMMUNITY): Payer: Self-pay | Admitting: Interventional Cardiology

## 2022-01-17 DIAGNOSIS — R9431 Abnormal electrocardiogram [ECG] [EKG]: Secondary | ICD-10-CM | POA: Diagnosis not present

## 2022-01-17 LAB — TROPONIN I (HIGH SENSITIVITY): Troponin I (High Sensitivity): 26 ng/L — ABNORMAL HIGH (ref <18)

## 2022-01-17 MED ORDER — LISINOPRIL 20 MG PO TABS: 20.0000 mg | ORAL_TABLET | Freq: Every day | ORAL | 0 refills | Status: DC

## 2022-01-17 MED ORDER — AMLODIPINE BESYLATE 2.5 MG PO TABS: 2.5000 mg | ORAL_TABLET | Freq: Every day | ORAL | 1 refills | Status: DC

## 2022-01-17 MED ORDER — ATORVASTATIN CALCIUM 80 MG PO TABS
80.0000 mg | ORAL_TABLET | Freq: Every day | ORAL | Status: DC
  Filled 2022-01-17: qty 1

## 2022-01-17 MED FILL — Nitroglycerin IV Soln 100 MCG/ML in D5W: INTRA_ARTERIAL | Qty: 10 | Status: AC

## 2022-01-17 NOTE — Progress Notes (Signed)
Nursing DC note  Patient alert and oriented verbalized understanding of dc instructions. All belongings given. Ccmd notified of patients dc order. Piv dcd, site unremarkable.

## 2022-01-17 NOTE — Plan of Care (Signed)
  Problem: Education: Goal: Knowledge of General Education information will improve Description: Including pain rating scale, medication(s)/side effects and non-pharmacologic comfort measures Outcome: Adequate for Discharge   

## 2022-01-17 NOTE — Discharge Summary (Addendum)
Discharge Summary    Patient ID: Jerry [name] MRN: [id number]; DOB: December 16, 1950  Admit date: 01/16/2022 Discharge date: 01/17/2022  PCP:  Alroy Dust, L.Marlou Sa, [facility] Providers Cardiologist:  Elouise Munroe, MD       Discharge Diagnoses    Principal Problem:   Abnormal ECG Active Problems:   Chest pain   Essential hypertension   Hyperlipidemia    Diagnostic Studies/Procedures    Cath: 01/16/22    Prox LAD lesion is 50% stenosed.   Mid LAD to Dist LAD lesion is 75% stenosed.   Ost 2nd Diag lesion is 100% stenosed.  Chronic total occlusion, noted in 2019 as well.   Mid LAD lesion is 50% stenosed.   Patent RCA stent.   The left ventricular systolic function is normal.   LV end diastolic pressure is normal.   The left ventricular ejection fraction is 55-65% by visual estimate.   There is no aortic valve stenosis.   Patent RCA stent.  TIMI-3 flow in the LAD.  No worsening of the anatomy from prior catheterization.  Mid LAD stenosis appears improved compared to 2019 cath.  Patient is currently pain-free.  Occluded second diagonal noted again.  Difficult to see any collateral filling.   Patient states that his blood pressure has been up and down.  Perhaps this affected collateral flow to the diagonal territory which caused ECG changes laterally.  No clear culprit lesion.  LAD disease would not explain ECG and appears unchanged.  TIMI-3 flow present.  No new lesions that would be target for PCI.  Diagnostic Dominance: Co-dominant    _____________   History of Present Illness     Jerry AVITABILE is a 71 y.o. male with PMH of HLD, HTN, NSTEMI s/p PCI of the RCA (medically managed disease in the LAD) who was seen 01/16/2022 for the evaluation of chest pain/STEMI. He is followed by cardiology as an outpatient, Dr. Margaretann Loveless. Presented with NSTEMI in 05/2018 with cath showing severe disease of the RCA s/p PCI/DES and residual disease in the LAD and D2 treated  medically. Placed on DAPT with ASA/Brilinta along with atorvastatin and lisinopril/HCTZ. Echo at that time showed LVEF of 60-65% with no rWMA. He was treated with metoprolol but developed dizziness and this was stopped.    He was last seen in the office on 11/16/21 and reported feeling better since stopping metoprolol, dizziness improved. Blood pressures were mildly elevated on BP logs therefore he was continued on lisinopril/HCTZ with amlodipine '5mg'$  daily added. Continued on ASA and statin.    Presented to the ED on 5/30 with complaints of chest pain over the past several days. Reported he had taken several SL nitro. Had called into the office and advised to come to the ED. EKG in the ED was concerning for slight lateral ST elevation. This was reviewed by Dr. Irish Lack and decision made to call CODE STEMI. Brought to the cath lab for emergent cardiac cath.   Hospital Course   Chest pain/abnormal EKG: intermittent chest pain over the past couple of days with use of SL nitro. EKG in the ED was concerning for lateral ST elevation in comparison with prior tracings. Given ASA and heparin in the ED. Brought to the cath lab for emergent cardiac cath. Cath noted above with patent RCA stent.  Mid LAD stenosis appeared actually improved when compared to 2019 cath.  Occluded second diagonal branch unchanged, difficult to see any collateral feeling.  Recommendations to continue medical therapy  as there were no new lesions felt to be suitable for PCI.  --Continue aspirin, statin, amlodipine 2.'5mg'$  daily, lisinopril   HLD: LDL 65 -- continue atorvastatin '80mg'$  daily    HTN: PTA meds lisinopril/HCTZ 20-25 and amlodipine '5mg'$  daily -- metoprolol stopped in the past 2/2 dizziness  -- will plan on stopping HCTZ component with episodes of orthostatis/dizziness -- continue lisinopril '20mg'$  daily, along with amlodipine '5mg'$  daily  General: Well developed, well nourished, male appearing in no acute distress. Head:  Normocephalic, atraumatic.  Neck: Supple without bruits, JVD. Lungs:  Resp regular and unlabored, CTA. Heart: RRR, S1, S2, no S3, S4, or murmur; no rub. Abdomen: Soft, non-tender, non-distended with normoactive bowel sounds. No hepatomegaly. No rebound/guarding. No obvious abdominal masses. Extremities: No clubbing, cyanosis, edema. Distal pedal pulses are 2+ bilaterally. Right cath site stable without bruising or hematoma Neuro: Alert and oriented X 3. Moves all extremities spontaneously. Psych: Normal affect.  Patient seen by Dr. Ali Lowe and deemed stable for discharge home. Follow up in the office arranged.   Did the patient have an acute coronary syndrome (MI, NSTEMI, STEMI, etc) this admission?:  No                               Did the patient have a percutaneous coronary intervention (stent / angioplasty)?:  No.        The patient will be scheduled for a TOC follow up appointment in 10-14 days.  A message has been sent to the [facility] and Scheduling Pool at the office where the patient should be seen for follow up.  _____________  Discharge Vitals Blood pressure 127/66, pulse (!) 54, temperature 98.3 F (36.8 C), temperature source Oral, resp. rate 16, height '5\' 10"'$  (1.778 m), weight 77.1 kg, SpO2 96 %.  Filed Weights   01/16/22 0959  Weight: 77.1 kg    Labs & Radiologic Studies    CBC Recent Labs    01/16/22 1007 01/16/22 1043  WBC 6.4  --   HGB 15.1 13.9  HCT 46.3 41.0  MCV 90.4  --   PLT 190  --    Basic Metabolic Panel Recent Labs    01/16/22 1007 01/16/22 1043  NA 137 141  K 3.5 3.6  CL 104 104  CO2 25  --   GLUCOSE 98 104*  BUN 23 24*  CREATININE 1.07 1.00  CALCIUM 9.3  --    Liver Function Tests No results for input(s): AST, ALT, ALKPHOS, BILITOT, PROT, ALBUMIN in the last 72 hours. No results for input(s): LIPASE, AMYLASE in the last 72 hours. High Sensitivity Troponin:   Recent Labs  Lab 01/16/22 1007 01/16/22 1545 01/17/22 0538   TROPONINIHS 4 14 26*    BNP Invalid input(s): POCBNP D-Dimer No results for input(s): DDIMER in the last 72 hours. Hemoglobin A1C Recent Labs    01/16/22 1007  HGBA1C 6.0*   Fasting Lipid Panel Recent Labs    01/16/22 1007  CHOL 126  HDL 39*  LDLCALC 65  TRIG 112  CHOLHDL 3.2   Thyroid Function Tests No results for input(s): TSH, T4TOTAL, T3FREE, THYROIDAB in the last 72 hours.  Invalid input(s): FREET3 _____________  CARDIAC CATHETERIZATION  Result Date: 01/16/2022   Prox LAD lesion is 50% stenosed.   Mid LAD to Dist LAD lesion is 75% stenosed.   Ost 2nd Diag lesion is 100% stenosed.  Chronic total occlusion, noted in 2019 as  well.   Mid LAD lesion is 50% stenosed.   Patent RCA stent.   The left ventricular systolic function is normal.   LV end diastolic pressure is normal.   The left ventricular ejection fraction is 55-65% by visual estimate.   There is no aortic valve stenosis. Patent RCA stent.  TIMI-3 flow in the LAD.  No worsening of the anatomy from prior catheterization.  Mid LAD stenosis appears improved compared to 2019 cath.  Patient is currently pain-free.  Occluded second diagonal noted again.  Difficult to see any collateral filling. Patient states that his blood pressure has been up and down.  Perhaps this affected collateral flow to the diagonal territory which caused ECG changes laterally.  No clear culprit lesion.  LAD disease would not explain ECG and appears unchanged.  TIMI-3 flow present.  No new lesions that would be target for PCI.   DG Chest Portable 1 View  Result Date: 01/16/2022 CLINICAL DATA:  Post catheterization EXAM: PORTABLE CHEST 1 VIEW COMPARISON:  Chest x-ray dated May 23, 2018 FINDINGS: Cardiac and mediastinal contours within normal limits. Mild left basilar opacity, likely due to atelectasis. Lungs otherwise clear. No evidence of pleural effusion or pneumothorax. IMPRESSION: No active disease. Electronically Signed   By: Yetta Glassman  M.D.   On: 01/16/2022 15:52    Disposition   Pt is being discharged home today in good condition.  Follow-up Plans & Appointments     Follow-up Information     Emmaline Life, NP Follow up on 01/25/2022.   Specialty: Nurse Practitioner Why: at 10:30am for your follow up appt with Dr. Hamilton Capri' NP Contact information: [place] [address] [phone]                Discharge Instructions     Call MD for:  difficulty breathing, headache or visual disturbances   Complete by: As directed    Call MD for:  persistant dizziness or light-headedness   Complete by: As directed    Call MD for:  redness, tenderness, or signs of infection (pain, swelling, redness, odor or green/yellow discharge around incision site)   Complete by: As directed    Diet - low sodium heart healthy   Complete by: As directed    Discharge instructions   Complete by: As directed    Radial Site Care Refer to this sheet in the next few weeks. These instructions provide you with information on caring for yourself after your procedure. Your caregiver may also give you more specific instructions. Your treatment has been planned according to current medical practices, but problems sometimes occur. Call your caregiver if you have any problems or questions after your procedure. HOME CARE INSTRUCTIONS You may shower the day after the procedure. Remove the bandage (dressing) and gently wash the site with plain soap and water. Gently pat the site dry.  Do not apply powder or lotion to the site.  Do not submerge the affected site in water for 3 to 5 days.  Inspect the site at least twice daily.  Do not flex or bend the affected arm for 24 hours.  No lifting over 5 pounds (2.3 kg) for 5 days after your procedure.  Do not drive home if you are discharged the same day of the procedure. Have someone else drive you.  You may drive 24 hours after the procedure unless otherwise instructed by  your caregiver.  What to expect: Any bruising will usually fade within  1 to 2 weeks.  Blood that collects in the tissue (hematoma) may be painful to the touch. It should usually decrease in size and tenderness within 1 to 2 weeks.  SEEK IMMEDIATE MEDICAL CARE IF: You have unusual pain at the radial site.  You have redness, warmth, swelling, or pain at the radial site.  You have drainage (other than a small amount of blood on the dressing).  You have chills.  You have a fever or persistent symptoms for more than 72 hours.  You have a fever and your symptoms suddenly get worse.  Your arm becomes pale, cool, tingly, or numb.  You have heavy bleeding from the site. Hold pressure on the site.   Increase activity slowly   Complete by: As directed        Discharge Medications   Allergies as of 01/17/2022       Reactions   Ciprofloxacin Other (See Comments)   Joint and muscle stiffness   Tamsulosin Other (See Comments)   Closed up nasal passages and couldn't breathe   Chloraprep One Step [chlorhexidine Gluconate] Rash   Sulfamethoxazole Hives, Itching, Rash, Swelling        Medication List     STOP taking these medications    lisinopril-hydrochlorothiazide 20-25 MG tablet Commonly known as: ZESTORETIC       TAKE these medications    acetaminophen 650 MG CR tablet Commonly known as: TYLENOL Take 650 mg by mouth every 8 (eight) hours as needed for pain.   amLODipine 2.5 MG tablet Commonly known as: NORVASC Take 1 tablet (2.5 mg total) by mouth daily. Start taking on: January 18, 2022 What changed:  medication strength how much to take   aspirin EC 81 MG tablet Take 1 tablet by mouth daily.   atorvastatin 80 MG tablet Commonly known as: LIPITOR Take 1 tablet (80 mg total) by mouth daily at 6 PM. What changed: when to take this   fluticasone 50 MCG/ACT nasal spray Commonly known as: FLONASE Place 2 sprays into both nostrils daily as needed for allergies.    ketoconazole 2 % cream Commonly known as: NIZORAL Apply 1 application topically daily as needed (for rosacea).   lisinopril 20 MG tablet Commonly known as: ZESTRIL Take 1 tablet (20 mg total) by mouth daily. Start taking on: January 18, 2022   Magnesium 250 MG Tabs Take 250 mg by mouth 2 (two) times daily.   mometasone 0.1 % ointment Commonly known as: ELOCON Apply 1 application. topically daily as needed (skin rash).   multivitamin tablet Take 1 tablet by mouth daily.   nitroGLYCERIN 0.4 MG SL tablet Commonly known as: NITROSTAT Place 1 tablet (0.4 mg total) under the tongue every 5 (five) minutes as needed for chest pain.   silodosin 8 MG Caps capsule Commonly known as: RAPAFLO Take 8 mg by mouth daily with breakfast.         Outstanding Labs/Studies   Na  Duration of Discharge Encounter   Greater than 30 minutes including physician time.  Signed, Reino Bellis, NP 01/17/2022, 11:33 AM   ATTENDING ATTESTATION:  After conducting a review of all available clinical information with the care team, interviewing the patient, and performing a physical exam, I agree with the findings and plan described in this note.   GEN: No acute distress.   HEENT:  MMM, no JVD, no scleral icterus Cardiac: RRR, no murmurs, rubs, or gallops.  Respiratory: Clear to auscultation bilaterally. GI: Soft, nontender, non-distended  MS: No  edema; No deformity. Neuro:  Nonfocal  Vasc:  +2 radial pulses  Patient doing well after urgent coronary angiography due to ST elevations.  This demonstrated moderate obstructive disease with an occluded second diagonal.  No PCI was pursued.  Given hypotension his amlodipine was decreased to 2.5 mg.  We also elicited a history of presyncope and for this reason hydrochlorothiazide was discontinued from his combination pill.  He remained chest pain-free during his hospitalization was found to be stable for discharge home with cardiology follow-up.  Lenna Sciara, MD Pager [phone]

## 2022-01-18 LAB — LIPOPROTEIN A (LPA): Lipoprotein (a): 46.3 nmol/L — ABNORMAL HIGH (ref <75.0)

## 2022-01-18 NOTE — Telephone Encounter (Signed)
-  Pt agreeable to appointment change -Appointment rescheduled for 6/8 at 11 am with Dr. Betha Loa, Marcina Millard, MD  You; Clinton Gallant, Belinda Block, RN 48 minutes ago (10:56 AM)   Please move his appt from APP schedule to my schedule, ok to overbook to 11 am slot June 8 with me.  GA

## 2022-01-25 ENCOUNTER — Encounter: Payer: Self-pay | Admitting: Internal Medicine

## 2022-01-25 ENCOUNTER — Ambulatory Visit: Payer: Medicare Other | Admitting: Internal Medicine

## 2022-01-25 VITALS — BP 130/76 | HR 50 | Ht 70.0 in | Wt 170.8 lb

## 2022-01-25 DIAGNOSIS — I214 Non-ST elevation (NSTEMI) myocardial infarction: Secondary | ICD-10-CM

## 2022-01-25 DIAGNOSIS — Z79899 Other long term (current) drug therapy: Secondary | ICD-10-CM | POA: Diagnosis not present

## 2022-01-25 DIAGNOSIS — R42 Dizziness and giddiness: Secondary | ICD-10-CM

## 2022-01-25 DIAGNOSIS — E785 Hyperlipidemia, unspecified: Secondary | ICD-10-CM

## 2022-01-25 DIAGNOSIS — I10 Essential (primary) hypertension: Secondary | ICD-10-CM | POA: Diagnosis not present

## 2022-01-25 DIAGNOSIS — I2510 Atherosclerotic heart disease of native coronary artery without angina pectoris: Secondary | ICD-10-CM | POA: Diagnosis not present

## 2022-01-25 DIAGNOSIS — R079 Chest pain, unspecified: Secondary | ICD-10-CM

## 2022-01-25 DIAGNOSIS — I358 Other nonrheumatic aortic valve disorders: Secondary | ICD-10-CM | POA: Diagnosis not present

## 2022-01-25 DIAGNOSIS — R55 Syncope and collapse: Secondary | ICD-10-CM

## 2022-01-25 NOTE — Patient Instructions (Signed)
Medication Instructions:  No Changes In Medications at this time.  *If you need a refill on your cardiac medications before your next appointment, please call your pharmacy*  Follow-Up: At [facility], you and your health needs are our priority.  As part of our continuing mission to provide you with exceptional heart care, we have created designated Provider Care Teams.  These Care Teams include your primary Cardiologist (physician) and Advanced Practice Providers (APPs -  Physician Assistants and Nurse Practitioners) who all work together to provide you with the care you need, when you need it.   Your next appointment:   05/03/22 at 8:40AM  The format for your next appointment:   In Person  Provider:   Elouise Munroe, MD

## 2022-01-25 NOTE — Progress Notes (Signed)
Cardiology Office Note:    Date:  01/25/2022   ID:  Jerry [name], DOB Jan 25, 1951, MRN [id number]  PCP:  Aurea Graff.Marlou Sa, MD  Cardiologist:  Elouise Munroe, MD  Electrophysiologist:  None   Referring MD: Aurea Graff.Marlou Sa, MD   Chief Complaint/Reason for Referral: CAD status post NSTEMI with PCI to the RCA and residual mid LAD disease medically managed  History of Present Illness:    EFREN [name] is a 71 y.o. male with history of hypertension, hyperlipidemia, migraine headaches previously on verapamil, who presents today for follow-up of NSTEMI with PCI to the RCA and evidence of residual complex LAD disease on 05/23/2018.   Feeling much better off of metoprolol. Only occasional positional dizziness. BP mildly elevated after stopping metoprolol. We had started amlodipine 5 mg daily to tightly control BP. Unfortunately he experienced chest pain concerning for ACS and code STEMI called due to slight lateral ST elevations. Cath showed stable disease and perhaps improved LAD stenosis. CP thought possibly due to hypoperfusion and amlodipine decreased to 2.5 mg daily.   He has done well since discharge and BP log is stable and acceptable. We discussed avoiding hypotension particularly since he is symptomatic when he is bradycardic or hypotensive, leading to medication dose adjustments over time.   The patient denies chest pain, chest pressure, dyspnea at rest or with exertion, palpitations, PND, orthopnea, or leg swelling. Denies cough, fever, chills. Denies nausea, vomiting. Denies syncope. Denies snoring.  Past Medical History:  Diagnosis Date   High cholesterol    History of kidney stones    Hypertension    Migraine    "none since ~ 2014 or before" (05/23/2018)    Past Surgical History:  Procedure Laterality Date   CORONARY STENT INTERVENTION N/A 05/26/2018   Procedure: CORONARY STENT INTERVENTION;  Surgeon: Martinique, Peter M, MD;  Location: [facility];  Service:  Cardiovascular;  Laterality: N/A;   CYSTOSCOPY W/ STONE MANIPULATION  2017   HERNIA REPAIR     INGUINAL HERNIA REPAIR Left 03/07/2016   Procedure: LEFT INGUINAL HERNIA REPAIR;  Surgeon: Donnie Mesa, MD;  Location: [facility];  Service: General;  Laterality: Left;   INSERTION OF MESH Left 03/07/2016   Procedure: INSERTION OF MESH;  Surgeon: Donnie Mesa, MD;  Location: [facility];  Service: General;  Laterality: Left;   LEFT HEART CATH AND CORONARY ANGIOGRAPHY N/A 05/26/2018   Procedure: LEFT HEART CATH AND CORONARY ANGIOGRAPHY;  Surgeon: Martinique, Peter M, MD;  Location: [facility];  Service: Cardiovascular;  Laterality: N/A;   LEFT HEART CATH AND CORONARY ANGIOGRAPHY N/A 01/16/2022   Procedure: LEFT HEART CATH AND CORONARY ANGIOGRAPHY;  Surgeon: Jettie Booze, MD;  Location: [facility];  Service: Cardiovascular;  Laterality: N/A;   LITHOTRIPSY  1990s    Current Medications: Current Meds  Medication Sig   acetaminophen (TYLENOL) 650 MG CR tablet Take 650 mg by mouth every 8 (eight) hours as needed for pain.   amLODipine (NORVASC) 2.5 MG tablet Take 1 tablet (2.5 mg total) by mouth daily.   aspirin EC 81 MG tablet Take 1 tablet by mouth daily.   atorvastatin (LIPITOR) 80 MG tablet Take 1 tablet (80 mg total) by mouth daily at 6 PM. (Patient taking differently: Take 80 mg by mouth every evening.)   fluticasone (FLONASE) 50 MCG/ACT nasal spray Place 2 sprays into both nostrils daily as needed for allergies.   ketoconazole (NIZORAL) 2 % cream Apply 1 application topically daily as  needed (for rosacea).   lisinopril (ZESTRIL) 20 MG tablet Take 1 tablet (20 mg total) by mouth daily.   Magnesium 250 MG TABS Take 250 mg by mouth 2 (two) times daily.   mometasone (ELOCON) 0.1 % ointment Apply 1 application. topically daily as needed (skin rash).   Multiple Vitamin (MULTIVITAMIN) tablet Take 1 tablet by mouth daily.   nitroGLYCERIN (NITROSTAT) 0.4 MG SL tablet Place 1 tablet (0.4 mg total)  under the tongue every 5 (five) minutes as needed for chest pain.   OVER THE COUNTER MEDICATION Eye drops as needed for itchy dry eyes   silodosin (RAPAFLO) 8 MG CAPS capsule Take 8 mg by mouth daily with breakfast.     Allergies:   Ciprofloxacin, Tamsulosin, Chloraprep one step [chlorhexidine gluconate], and Sulfamethoxazole   Social History   Tobacco Use   Smoking status: Former    Types: Pipe    Quit date: 02/28/2000    Years since quitting: 22.0   Smokeless tobacco: Never  Vaping Use   Vaping Use: Never used  Substance Use Topics   Alcohol use: Yes    Comment: 05/23/2018 "nothing since ~ 10/2017"   Drug use: Never     Family History: The patient's family history includes Hypertension in his father.  ROS:   Please see the history of present illness.    All other systems reviewed and are negative.  EKGs/Labs/Other Studies Reviewed:    The following studies were reviewed today:  EKG: sinus brady rate 50  Recent Labs: 01/16/2022: BUN 24; Creatinine, Ser 1.00; Hemoglobin 13.9; Platelets 190; Potassium 3.6; Sodium 141  Recent Lipid Panel    Component Value Date/Time   CHOL 126 01/16/2022 1007   TRIG 112 01/16/2022 1007   HDL 39 (L) 01/16/2022 1007   CHOLHDL 3.2 01/16/2022 1007   VLDL 22 01/16/2022 1007   LDLCALC 65 01/16/2022 1007    Physical Exam:    VS:  BP 130/76   Pulse (!) 50   Ht '5\' 10"'$  (1.778 m)   Wt 170 lb 12.8 oz (77.5 kg)   SpO2 95%   BMI 24.51 kg/m     Wt Readings from Last 5 Encounters:  01/25/22 170 lb 12.8 oz (77.5 kg)  01/16/22 170 lb (77.1 kg)  11/16/21 171 lb 3.2 oz (77.7 kg)  05/12/21 170 lb (77.1 kg)  03/30/21 172 lb (78 kg)    Constitutional: No acute distress Eyes: sclera non-icteric, normal conjunctiva and lids ENMT: normal dentition, moist mucous membranes Cardiovascular: regular rhythm, normal rate, 1/6 SEM. S1 and S2 normal. Radial pulses normal bilaterally. No jugular venous distention.  Respiratory: clear to auscultation  bilaterally GI : normal bowel sounds, soft and nontender. No distention.   MSK: extremities warm, well perfused. No edema.  NEURO: grossly nonfocal exam, moves all extremities. PSYCH: alert and oriented x 3, normal mood and affect.    ASSESSMENT:    1. Coronary artery disease involving native coronary artery of native heart without angina pectoris   2. Chest pain, unspecified type   3. Essential hypertension   4. Dizziness   5. Postural dizziness with presyncope   6. Medication management   7. Non-ST elevation (NSTEMI) myocardial infarction ([place])   8. Hyperlipidemia, unspecified hyperlipidemia type   9. Aortic valve sclerosis      PLAN:     NSTEMI Chest pain -He has had PCI to the RCA and has residual LAD disease that we are medically managing. Recent hospitalization for chest pain shows stable CAD. -  Continue aspirin 81 mg daily, atorvastatin 80 mg daily.  Dizziness Presyncope Med management - feels much better after stopping metoprolol - normal echo - recommend compression socks, he is compliant.  Hypertension-continue lisinopril HCTZ 20-25 mg.  Blood pressure is mildly elevated today and on home readings. Amlodipine dose reduced due to concerns that hypotension caused CP.  Hyperlipidemia-continue atorvastatin 80 mg daily.  Total time of encounter: 30 minutes total time of encounter, including 20 minutes spent in face-to-face patient care on the date of this encounter. This time includes coordination of care and counseling regarding above mentioned problem list. Remainder of non-face-to-face time involved reviewing chart documents/testing relevant to the patient encounter and documentation in the medical record. I have independently reviewed documentation from referring provider.   Cherlynn Kaiser, MD, [facility]     Medication Adjustments/Labs and Tests Ordered: Current medicines are reviewed at length with the patient today.  Concerns  regarding medicines are outlined above.   Orders Placed This Encounter  Procedures   EKG 12-Lead     No orders of the defined types were placed in this encounter.    Patient Instructions  Medication Instructions:  No Changes In Medications at this time.  *If you need a refill on your cardiac medications before your next appointment, please call your pharmacy*  Follow-Up: At [facility], you and your health needs are our priority.  As part of our continuing mission to provide you with exceptional heart care, we have created designated Provider Care Teams.  These Care Teams include your primary Cardiologist (physician) and Advanced Practice Providers (APPs -  Physician Assistants and Nurse Practitioners) who all work together to provide you with the care you need, when you need it.   Your next appointment:   05/03/22 at 8:40AM  The format for your next appointment:   In Person  Provider:   Elouise Munroe, MD

## 2022-03-09 ENCOUNTER — Encounter: Payer: Self-pay | Admitting: Internal Medicine

## 2022-03-14 DIAGNOSIS — E7800 Pure hypercholesterolemia, unspecified: Secondary | ICD-10-CM | POA: Diagnosis not present

## 2022-03-14 DIAGNOSIS — M25552 Pain in left hip: Secondary | ICD-10-CM | POA: Diagnosis not present

## 2022-03-14 DIAGNOSIS — I2510 Atherosclerotic heart disease of native coronary artery without angina pectoris: Secondary | ICD-10-CM | POA: Diagnosis not present

## 2022-03-14 DIAGNOSIS — I10 Essential (primary) hypertension: Secondary | ICD-10-CM | POA: Diagnosis not present

## 2022-04-15 ENCOUNTER — Other Ambulatory Visit: Payer: Self-pay | Admitting: Cardiology

## 2022-04-16 NOTE — Telephone Encounter (Signed)
This is Dr. Acharya's pt ?

## 2022-04-18 DIAGNOSIS — Z87898 Personal history of other specified conditions: Secondary | ICD-10-CM | POA: Diagnosis not present

## 2022-04-18 DIAGNOSIS — N200 Calculus of kidney: Secondary | ICD-10-CM | POA: Diagnosis not present

## 2022-04-18 DIAGNOSIS — R3914 Feeling of incomplete bladder emptying: Secondary | ICD-10-CM | POA: Diagnosis not present

## 2022-05-03 ENCOUNTER — Ambulatory Visit: Payer: Medicare Other | Attending: Internal Medicine | Admitting: Internal Medicine

## 2022-05-03 ENCOUNTER — Encounter: Payer: Self-pay | Admitting: Internal Medicine

## 2022-05-03 VITALS — BP 130/68 | HR 64 | Ht 70.0 in | Wt 170.0 lb

## 2022-05-03 DIAGNOSIS — I214 Non-ST elevation (NSTEMI) myocardial infarction: Secondary | ICD-10-CM | POA: Diagnosis not present

## 2022-05-03 DIAGNOSIS — R079 Chest pain, unspecified: Secondary | ICD-10-CM | POA: Diagnosis not present

## 2022-05-03 DIAGNOSIS — R55 Syncope and collapse: Secondary | ICD-10-CM

## 2022-05-03 DIAGNOSIS — R42 Dizziness and giddiness: Secondary | ICD-10-CM | POA: Diagnosis not present

## 2022-05-03 DIAGNOSIS — Z79899 Other long term (current) drug therapy: Secondary | ICD-10-CM | POA: Diagnosis not present

## 2022-05-03 NOTE — Progress Notes (Signed)
Cardiology Office Note:    Date:  05/03/2022   ID:  Jerry [name], DOB 1950/11/26, MRN [id number]  PCP:  Aurea Graff.Marlou Sa, MD  Cardiologist:  Elouise Munroe, MD  Electrophysiologist:  None   Referring MD: Aurea Graff.Marlou Sa, MD   Chief Complaint/Reason for Referral: CAD status post NSTEMI with PCI to the RCA and residual mid LAD disease medically managed  History of Present Illness:    Jerry HAEGELE is a 71 y.o. male with history of hypertension, hyperlipidemia, migraine headaches previously on verapamil, who presents today for follow-up of NSTEMI with PCI to the RCA and evidence of residual complex LAD disease on 05/23/2018.   Feeling much better off of metoprolol. Only occasional positional dizziness. BP mildly elevated after stopping metoprolol. We had started amlodipine 5 mg daily to tightly control BP. Unfortunately he experienced chest pain concerning for ACS and code STEMI called due to slight lateral ST elevations. Cath showed stable disease and perhaps improved LAD stenosis. CP thought possibly due to hypoperfusion and amlodipine decreased to 2.5 mg daily. We discussed avoiding hypotension particularly since he is symptomatic when he is bradycardic or hypotensive, leading to medication dose adjustments over time. Patient has some concern about continued borderline elevated BP. Discussed that we could increase amlodipine on a trial basis to ensure no hypotension.   The patient denies chest pain, chest pressure, dyspnea at rest or with exertion, palpitations, PND, orthopnea, or leg swelling. Denies cough, fever, chills. Denies nausea, vomiting. Denies syncope. Denies snoring.  Past Medical History:  Diagnosis Date   High cholesterol    History of kidney stones    Hypertension    Migraine    "none since ~ 2014 or before" (05/23/2018)    Past Surgical History:  Procedure Laterality Date   CORONARY STENT INTERVENTION N/A 05/26/2018   Procedure: CORONARY STENT INTERVENTION;   Surgeon: Martinique, Peter M, MD;  Location: [place] [place] [facility];  Service: Cardiovascular;  Laterality: N/A;   CYSTOSCOPY W/ STONE MANIPULATION  2017   HERNIA REPAIR     INGUINAL HERNIA REPAIR Left 03/07/2016   Procedure: LEFT INGUINAL HERNIA REPAIR;  Surgeon: Donnie Mesa, MD;  Location: [place];  Service: General;  Laterality: Left;   INSERTION OF MESH Left 03/07/2016   Procedure: INSERTION OF MESH;  Surgeon: Donnie Mesa, MD;  Location: [place];  Service: General;  Laterality: Left;   LEFT HEART CATH AND CORONARY ANGIOGRAPHY N/A 05/26/2018   Procedure: LEFT HEART CATH AND CORONARY ANGIOGRAPHY;  Surgeon: Martinique, Peter M, MD;  Location: [place] [facility];  Service: Cardiovascular;  Laterality: N/A;   LEFT HEART CATH AND CORONARY ANGIOGRAPHY N/A 01/16/2022   Procedure: LEFT HEART CATH AND CORONARY ANGIOGRAPHY;  Surgeon: Jettie Booze, MD;  Location: [facility] [facility];  Service: Cardiovascular;  Laterality: N/A;   LITHOTRIPSY  1990s    Current Medications: Current Meds  Medication Sig   acetaminophen (TYLENOL) 650 MG CR tablet Take 650 mg by mouth every 8 (eight) hours as needed for pain.   amLODipine (NORVASC) 2.5 MG tablet Take 1 tablet (2.5 mg total) by mouth daily.   aspirin EC 81 MG tablet Take 1 tablet by mouth daily.   fluticasone (FLONASE) 50 MCG/ACT nasal spray Place 2 sprays into both nostrils daily as needed for allergies.   ketoconazole (NIZORAL) 2 % cream Apply 1 application topically daily as needed (for rosacea).   lisinopril (ZESTRIL) 20 MG tablet TAKE 1 TABLET ONCE DAILY.   Magnesium 250 MG TABS  Take 250 mg by mouth 2 (two) times daily.   mometasone (ELOCON) 0.1 % ointment Apply 1 application. topically daily as needed (skin rash).   Multiple Vitamin (MULTIVITAMIN) tablet Take 1 tablet by mouth daily.   OVER THE COUNTER MEDICATION Eye drops as needed for itchy dry eyes   silodosin (RAPAFLO) 8 MG CAPS capsule Take 8 mg by mouth daily with breakfast.     Allergies:    Ciprofloxacin, Tamsulosin, Chloraprep one step [chlorhexidine gluconate], and Sulfamethoxazole   Social History   Tobacco Use   Smoking status: Former    Types: Pipe    Quit date: 02/28/2000    Years since quitting: 22.2   Smokeless tobacco: Never  Vaping Use   Vaping Use: Never used  Substance Use Topics   Alcohol use: Yes    Comment: 05/23/2018 "nothing since ~ 10/2017"   Drug use: Never     Family History: The patient's family history includes Hypertension in his father.  ROS:   Please see the history of present illness.    All other systems reviewed and are negative.  EKGs/Labs/Other Studies Reviewed:    The following studies were reviewed today:  EKG:   Recent Labs: 01/16/2022: BUN 24; Creatinine, Ser 1.00; Hemoglobin 13.9; Platelets 190; Potassium 3.6; Sodium 141  Recent Lipid Panel    Component Value Date/Time   CHOL 126 01/16/2022 1007   TRIG 112 01/16/2022 1007   HDL 39 (L) 01/16/2022 1007   CHOLHDL 3.2 01/16/2022 1007   VLDL 22 01/16/2022 1007   LDLCALC 65 01/16/2022 1007    Physical Exam:    VS:  BP 130/68   Pulse 64   Ht '5\' 10"'$  (1.778 m)   Wt 170 lb (77.1 kg)   SpO2 98%   BMI 24.39 kg/m     Wt Readings from Last 5 Encounters:  05/03/22 170 lb (77.1 kg)  01/25/22 170 lb 12.8 oz (77.5 kg)  01/16/22 170 lb (77.1 kg)  11/16/21 171 lb 3.2 oz (77.7 kg)  05/12/21 170 lb (77.1 kg)    Constitutional: No acute distress Eyes: sclera non-icteric, normal conjunctiva and lids ENMT: normal dentition, moist mucous membranes Cardiovascular: regular rhythm, normal rate, 1/6 SEM. S1 and S2 normal. Radial pulses normal bilaterally. No jugular venous distention.  Respiratory: clear to auscultation bilaterally GI : normal bowel sounds, soft and nontender. No distention.   MSK: extremities warm, well perfused. No edema.  NEURO: grossly nonfocal exam, moves all extremities. PSYCH: alert and oriented x 3, normal mood and affect.    ASSESSMENT:    No diagnosis  found.    PLAN:     NSTEMI Chest pain -He has had PCI to the RCA and has residual LAD disease that we are medically managing. Recent hospitalization for chest pain shows stable CAD. -Continue aspirin 81 mg daily, atorvastatin 80 mg daily.  Dizziness Presyncope Med management - feels better on lower dose of amlodipine - but will trial going up to 5 mg for optimal BP per patient preference. - normal echo - recommend compression socks, he is compliant.  Hypertension-continue lisinopril HCTZ 20-25 mg.  Blood pressure is mildly elevated today and on home readings. Amlodipine dose reduced due to concerns that hypotension caused CP, however will increase per patient preference as noted above.  Hyperlipidemia-continue atorvastatin 80 mg daily.  Total time of encounter: 30 minutes total time of encounter, including 20 minutes spent in face-to-face patient care on the date of this encounter. This time includes coordination of care  and counseling regarding above mentioned problem list. Remainder of non-face-to-face time involved reviewing chart documents/testing relevant to the patient encounter and documentation in the medical record. I have independently reviewed documentation from referring provider.   Cherlynn Kaiser, MD, [facility]     Medication Adjustments/Labs and Tests Ordered: Current medicines are reviewed at length with the patient today.  Concerns regarding medicines are outlined above.   No orders of the defined types were placed in this encounter.    No orders of the defined types were placed in this encounter.    Patient Instructions  Medication Instructions:  INCREASE AMLODIPINE TO '5mg'$  AND LET us KNOW VIA MYCHART HOW YOU ARE DOING WITH THIS  *If you need a refill on your cardiac medications before your next appointment, please call your pharmacy*  Follow-Up: At [facility], you and your health needs are our priority.  As part of  our continuing mission to provide you with exceptional heart care, we have created designated Provider Care Teams.  These Care Teams include your primary Cardiologist (physician) and Advanced Practice Providers (APPs -  Physician Assistants and Nurse Practitioners) who all work together to provide you with the care you need, when you need it.  Your next appointment:   6 month(s)  The format for your next appointment:   In Person  Provider:   Elouise Munroe, MD

## 2022-05-03 NOTE — Patient Instructions (Signed)
Medication Instructions:  INCREASE AMLODIPINE TO '5mg'$  AND LET us KNOW VIA MYCHART HOW YOU ARE DOING WITH THIS  *If you need a refill on your cardiac medications before your next appointment, please call your pharmacy*  Follow-Up: At [facility], you and your health needs are our priority.  As part of our continuing mission to provide you with exceptional heart care, we have created designated Provider Care Teams.  These Care Teams include your primary Cardiologist (physician) and Advanced Practice Providers (APPs -  Physician Assistants and Nurse Practitioners) who all work together to provide you with the care you need, when you need it.  Your next appointment:   6 month(s)  The format for your next appointment:   In Person  Provider:   Elouise Munroe, MD

## 2022-05-22 ENCOUNTER — Encounter: Payer: Self-pay | Admitting: Internal Medicine

## 2022-07-21 IMAGING — DX DG CHEST 1V PORT
2 series · 2 of 2 positions shown · non-contrast
Comparison: Chest x-ray dated May 23, 2018

CLINICAL DATA: Post catheterization

EXAM:
PORTABLE CHEST 1 VIEW

[chest ap (1 of 2)]
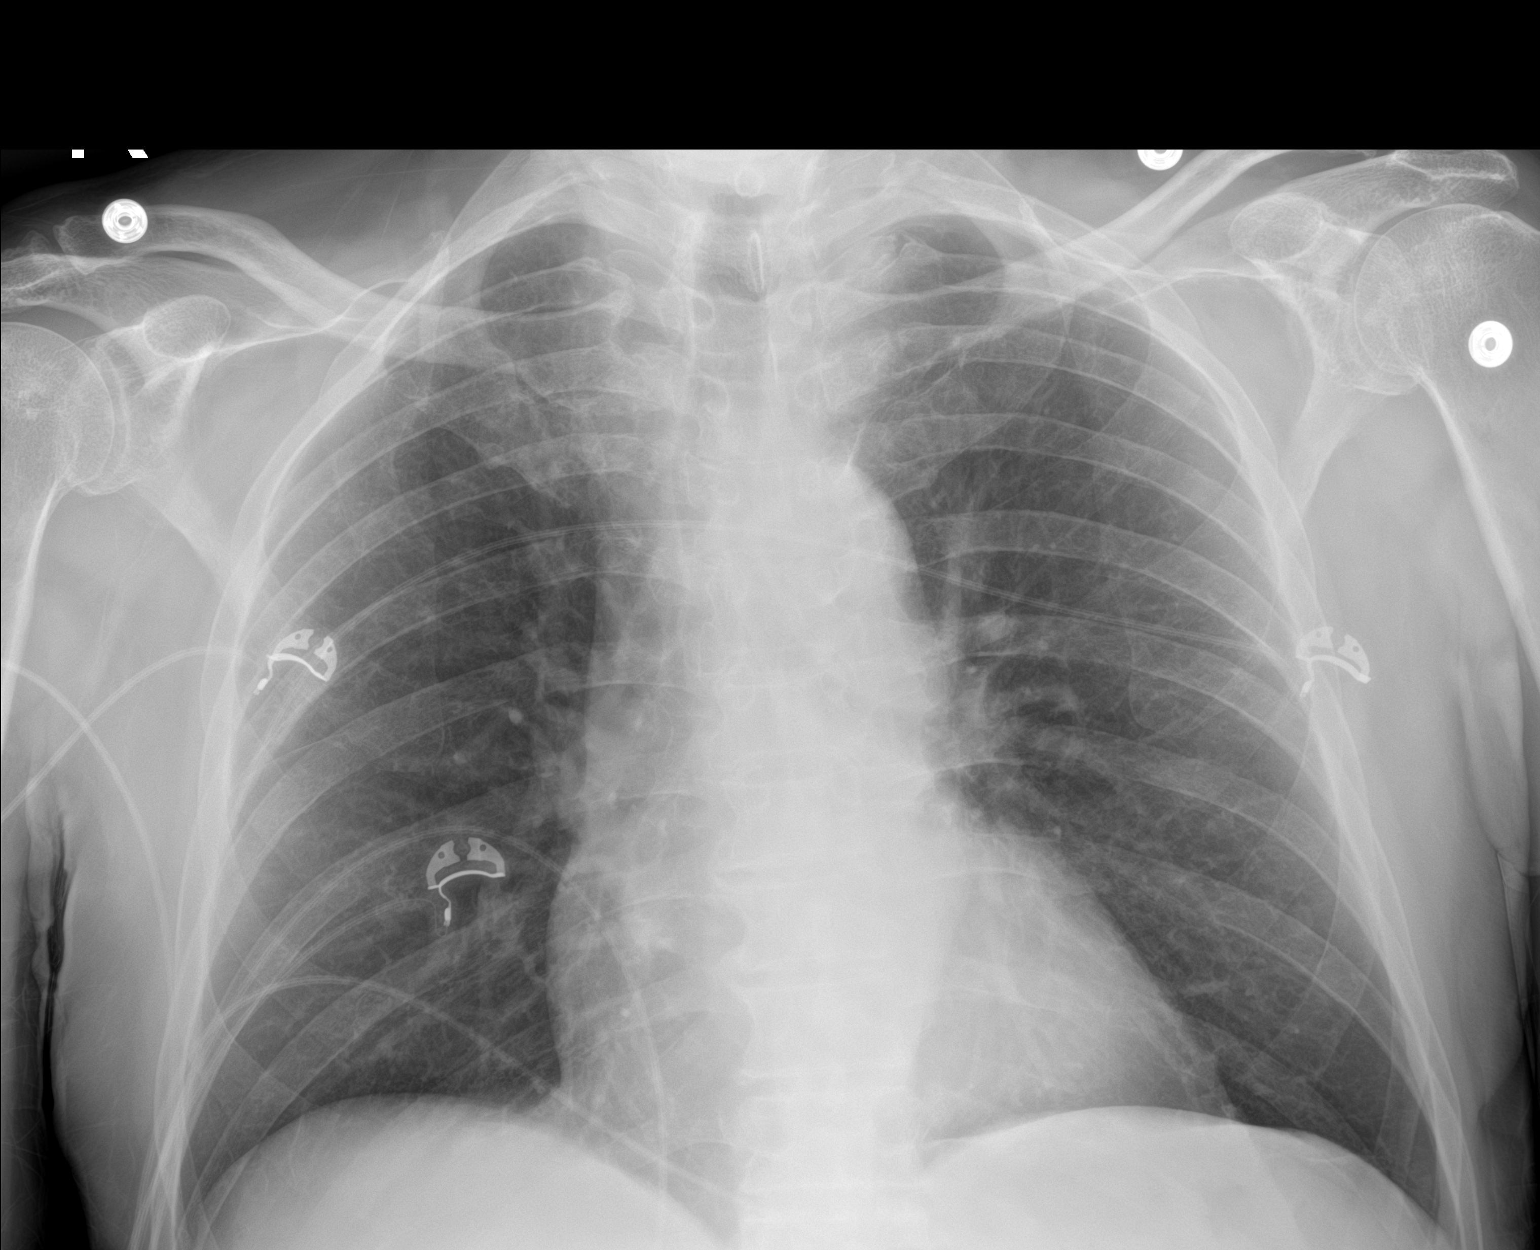

[chest ap (2 of 2)]
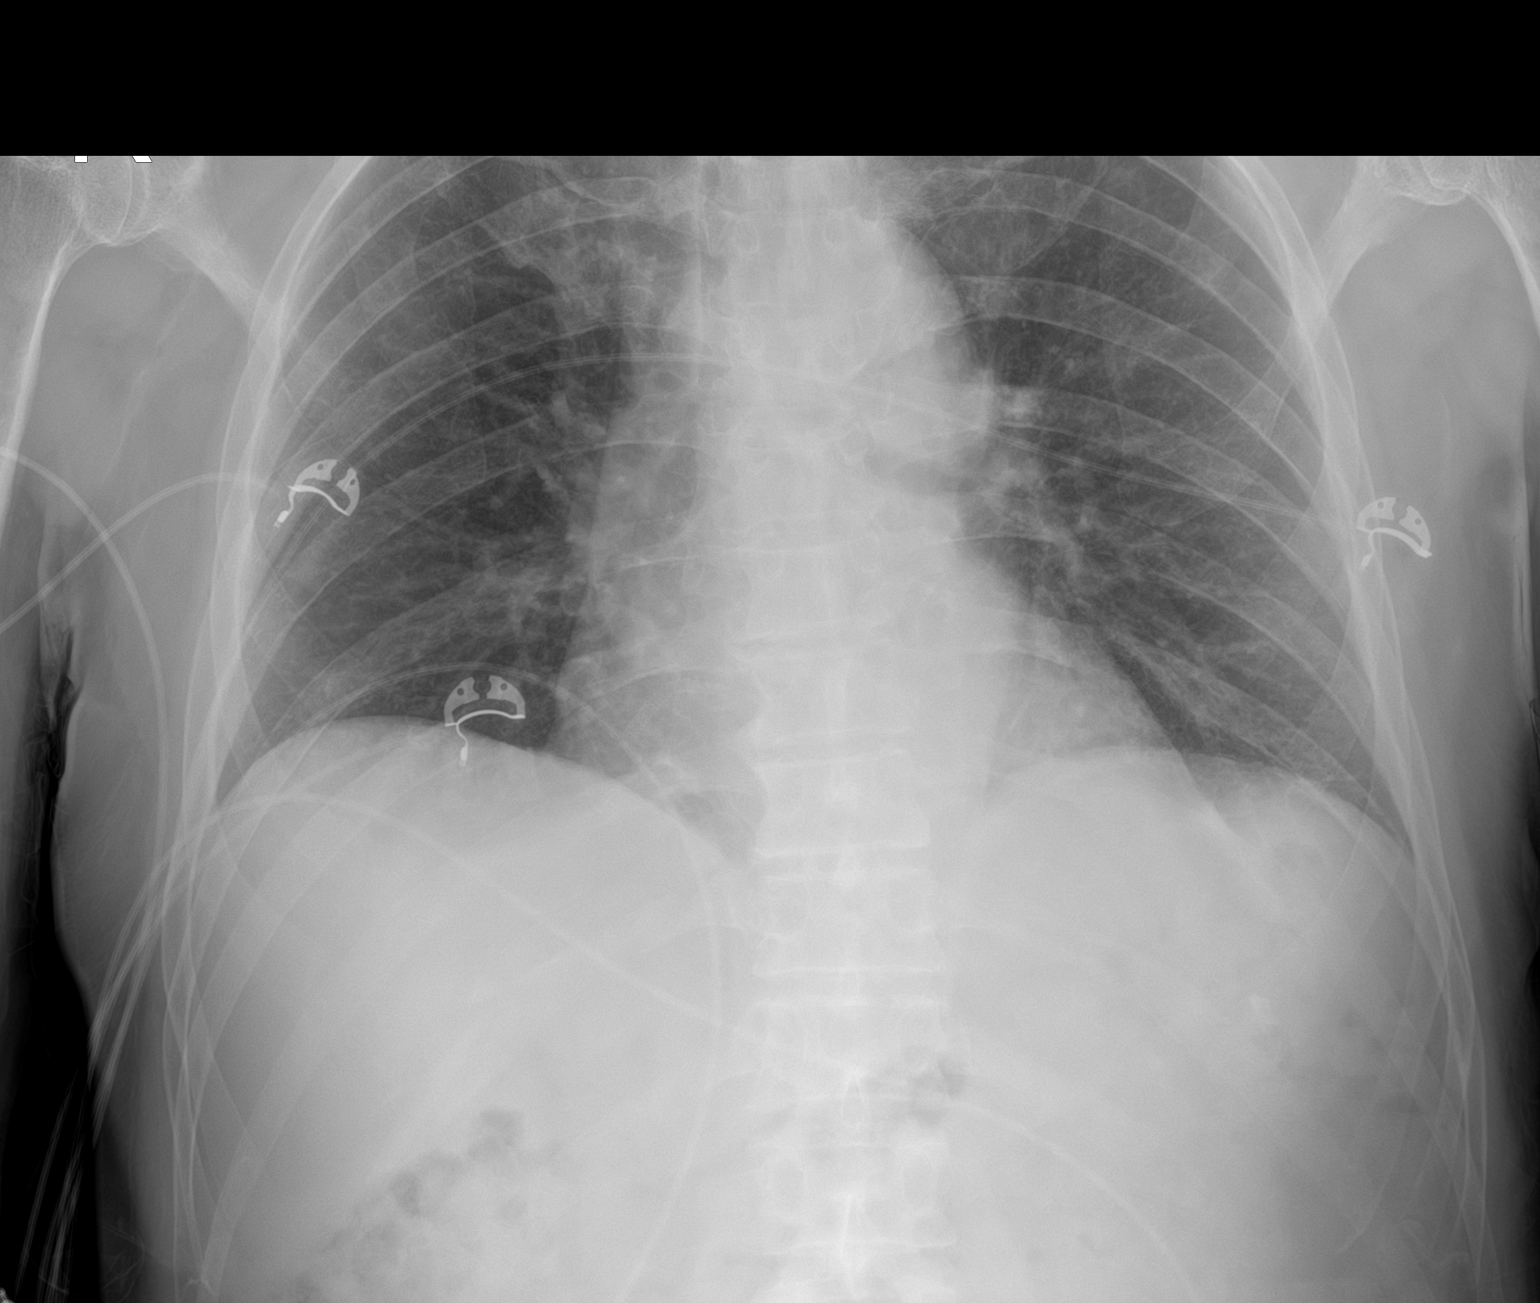

[2 of 2 positions shown; findings below may reference images not displayed]

FINDINGS: Cardiac and mediastinal contours within normal limits. Mild left
basilar opacity, likely due to atelectasis. Lungs otherwise clear.
No evidence of pleural effusion or pneumothorax.
IMPRESSION: No active disease.

## 2022-08-22 DIAGNOSIS — Z85828 Personal history of other malignant neoplasm of skin: Secondary | ICD-10-CM | POA: Diagnosis not present

## 2022-08-22 DIAGNOSIS — L570 Actinic keratosis: Secondary | ICD-10-CM | POA: Diagnosis not present

## 2022-09-10 DIAGNOSIS — Z0000 Encounter for general adult medical examination without abnormal findings: Secondary | ICD-10-CM | POA: Diagnosis not present

## 2022-09-10 DIAGNOSIS — E7800 Pure hypercholesterolemia, unspecified: Secondary | ICD-10-CM | POA: Diagnosis not present

## 2022-09-10 DIAGNOSIS — I10 Essential (primary) hypertension: Secondary | ICD-10-CM | POA: Diagnosis not present

## 2022-09-25 DIAGNOSIS — D2371 Other benign neoplasm of skin of right lower limb, including hip: Secondary | ICD-10-CM | POA: Diagnosis not present

## 2022-09-25 DIAGNOSIS — M79671 Pain in right foot: Secondary | ICD-10-CM | POA: Diagnosis not present

## 2022-11-02 ENCOUNTER — Encounter: Payer: Self-pay | Admitting: Internal Medicine

## 2022-11-02 ENCOUNTER — Ambulatory Visit: Payer: Medicare Other | Attending: Internal Medicine | Admitting: Internal Medicine

## 2022-11-02 VITALS — BP 110/66 | HR 63 | Ht 70.0 in | Wt 169.0 lb

## 2022-11-02 DIAGNOSIS — I358 Other nonrheumatic aortic valve disorders: Secondary | ICD-10-CM | POA: Diagnosis not present

## 2022-11-02 DIAGNOSIS — I10 Essential (primary) hypertension: Secondary | ICD-10-CM | POA: Diagnosis not present

## 2022-11-02 DIAGNOSIS — R079 Chest pain, unspecified: Secondary | ICD-10-CM

## 2022-11-02 DIAGNOSIS — Z79899 Other long term (current) drug therapy: Secondary | ICD-10-CM | POA: Diagnosis not present

## 2022-11-02 DIAGNOSIS — R55 Syncope and collapse: Secondary | ICD-10-CM | POA: Diagnosis not present

## 2022-11-02 DIAGNOSIS — I2510 Atherosclerotic heart disease of native coronary artery without angina pectoris: Secondary | ICD-10-CM | POA: Diagnosis not present

## 2022-11-02 DIAGNOSIS — I214 Non-ST elevation (NSTEMI) myocardial infarction: Secondary | ICD-10-CM

## 2022-11-02 DIAGNOSIS — R42 Dizziness and giddiness: Secondary | ICD-10-CM

## 2022-11-02 DIAGNOSIS — E785 Hyperlipidemia, unspecified: Secondary | ICD-10-CM | POA: Diagnosis not present

## 2022-11-02 MED ORDER — AMLODIPINE BESYLATE 5 MG PO TABS: 5.0000 mg | ORAL_TABLET | Freq: Every day | ORAL | 3 refills | Status: DC

## 2022-11-02 NOTE — Patient Instructions (Signed)
Medication Instructions:  No Changes In Medications at this time.  *If you need a refill on your cardiac medications before your next appointment, please call your pharmacy*  Follow-Up: At [facility], you and your health needs are our priority.  As part of our continuing mission to provide you with exceptional heart care, we have created designated Provider Care Teams.  These Care Teams include your primary Cardiologist (physician) and Advanced Practice Providers (APPs -  Physician Assistants and Nurse Practitioners) who all work together to provide you with the care you need, when you need it.  Your next appointment:   6 month(s)  Provider:   Gayatri A Acharya, MD    

## 2022-11-02 NOTE — Progress Notes (Signed)
Cardiology Office Note:    Date:  11/02/2022   ID:  Jerry [name], DOB 1950/12/26, MRN [id number]  PCP:  Aurea Graff.Marlou Sa, MD  Cardiologist:  Elouise Munroe, MD  Electrophysiologist:  None   Referring MD: Aurea Graff.Marlou Sa, MD   Chief Complaint/Reason for Referral: CAD status post NSTEMI with PCI to the RCA and residual mid LAD disease medically managed  History of Present Illness:    Jerry [name] is a 72 y.o. male with history of hypertension, hyperlipidemia, migraine headaches previously on verapamil, who presents today for follow-up of NSTEMI with PCI to the RCA and evidence of residual complex LAD disease on 05/23/2018.   Feeling much better off of metoprolol. Only occasional positional dizziness. BP mildly elevated after stopping metoprolol. We had started amlodipine 5 mg daily to tightly control BP. Unfortunately he experienced chest pain concerning for ACS and code STEMI called due to slight lateral ST elevations. Cath showed stable disease and perhaps improved LAD stenosis. CP thought possibly due to hypoperfusion and amlodipine decreased to 2.5 mg daily. We discussed avoiding hypotension particularly since he is symptomatic when he is bradycardic or hypotensive, leading to medication dose adjustments over time. Patient has some concern about continued borderline elevated BP. Discussed that we could increase amlodipine on a trial basis to ensure no hypotension. This went well and he continues on amlodipine 5 mg daily per his preference.   Infrequent positional lightheadedness with bending over but rare.   The patient denies chest pain, chest pressure, dyspnea at rest or with exertion, palpitations, PND, orthopnea, or leg swelling. Denies cough, fever, chills. Denies nausea, vomiting. Denies syncope. Denies snoring.  Past Medical History:  Diagnosis Date   High cholesterol    History of kidney stones    Hypertension    Migraine    "none since ~ 2014 or before"  (05/23/2018)    Past Surgical History:  Procedure Laterality Date   CORONARY STENT INTERVENTION N/A 05/26/2018   Procedure: CORONARY STENT INTERVENTION;  Surgeon: Martinique, Peter M, MD;  Location: [place] [facility];  Service: Cardiovascular;  Laterality: N/A;   CYSTOSCOPY W/ STONE MANIPULATION  2017   HERNIA REPAIR     INGUINAL HERNIA REPAIR Left 03/07/2016   Procedure: LEFT INGUINAL HERNIA REPAIR;  Surgeon: Donnie Mesa, MD;  Location: [place];  Service: General;  Laterality: Left;   INSERTION OF MESH Left 03/07/2016   Procedure: INSERTION OF MESH;  Surgeon: Donnie Mesa, MD;  Location: [place];  Service: General;  Laterality: Left;   LEFT HEART CATH AND CORONARY ANGIOGRAPHY N/A 05/26/2018   Procedure: LEFT HEART CATH AND CORONARY ANGIOGRAPHY;  Surgeon: Martinique, Peter M, MD;  Location: [place] [facility];  Service: Cardiovascular;  Laterality: N/A;   LEFT HEART CATH AND CORONARY ANGIOGRAPHY N/A 01/16/2022   Procedure: LEFT HEART CATH AND CORONARY ANGIOGRAPHY;  Surgeon: Jettie Booze, MD;  Location: [place] [facility];  Service: Cardiovascular;  Laterality: N/A;   LITHOTRIPSY  1990s    Current Medications: Current Meds  Medication Sig   acetaminophen (TYLENOL) 650 MG CR tablet Take 650 mg by mouth every 8 (eight) hours as needed for pain.   aspirin EC 81 MG tablet Take 1 tablet by mouth daily.   atorvastatin (LIPITOR) 80 MG tablet Take 1 tablet (80 mg total) by mouth daily at 6 PM.   fluticasone (FLONASE) 50 MCG/ACT nasal spray Place 2 sprays into both nostrils daily as needed for allergies.   ketoconazole (NIZORAL) 2 % cream  Apply 1 application topically daily as needed (for rosacea).   lisinopril (ZESTRIL) 20 MG tablet TAKE 1 TABLET ONCE DAILY.   Magnesium 250 MG TABS Take 250 mg by mouth 2 (two) times daily.   mometasone (ELOCON) 0.1 % ointment Apply 1 application. topically daily as needed (skin rash).   Multiple Vitamin (MULTIVITAMIN) tablet Take 1 tablet by mouth daily.    nitroGLYCERIN (NITROSTAT) 0.4 MG SL tablet Place 1 tablet (0.4 mg total) under the tongue every 5 (five) minutes as needed for chest pain.   OVER THE COUNTER MEDICATION Eye drops as needed for itchy dry eyes   silodosin (RAPAFLO) 8 MG CAPS capsule Take 8 mg by mouth daily with breakfast.   [DISCONTINUED] amLODipine (NORVASC) 2.5 MG tablet Take 1 tablet (2.5 mg total) by mouth daily.     Allergies:   Ciprofloxacin, Tamsulosin, Chloraprep one step [chlorhexidine gluconate], and Sulfamethoxazole   Social History   Tobacco Use   Smoking status: Former    Types: Pipe    Quit date: 02/28/2000    Years since quitting: 22.6   Smokeless tobacco: Never  Vaping Use   Vaping Use: Never used  Substance Use Topics   Alcohol use: Yes    Comment: 05/23/2018 "nothing since ~ 10/2017"   Drug use: Never     Family History: The patient's family history includes Hypertension in his father.  ROS:   Please see the history of present illness.    All other systems reviewed and are negative.  EKGs/Labs/Other Studies Reviewed:    The following studies were reviewed today:  EKG: NSR, sinus arrhythmia  Recent Labs: 01/16/2022: BUN 24; Creatinine, Ser 1.00; Hemoglobin 13.9; Platelets 190; Potassium 3.6; Sodium 141  Recent Lipid Panel    Component Value Date/Time   CHOL 126 01/16/2022 1007   TRIG 112 01/16/2022 1007   HDL 39 (L) 01/16/2022 1007   CHOLHDL 3.2 01/16/2022 1007   VLDL 22 01/16/2022 1007   LDLCALC 65 01/16/2022 1007    Physical Exam:    VS:  BP 110/66 (BP Location: Left Arm, Patient Position: Sitting, Cuff Size: Normal)   Pulse 63   Ht 5\' 10"  (1.778 m)   Wt 169 lb (76.7 kg)   BMI 24.25 kg/m     Wt Readings from Last 5 Encounters:  11/02/22 169 lb (76.7 kg)  05/03/22 170 lb (77.1 kg)  01/25/22 170 lb 12.8 oz (77.5 kg)  01/16/22 170 lb (77.1 kg)  11/16/21 171 lb 3.2 oz (77.7 kg)    Constitutional: No acute distress Eyes: sclera non-icteric, normal conjunctiva and lids ENMT:  normal dentition, moist mucous membranes Cardiovascular: regular rhythm, normal rate, 1/6 SEM. S1 and S2 normal. Radial pulses normal bilaterally. No jugular venous distention.  Respiratory: clear to auscultation bilaterally GI : normal bowel sounds, soft and nontender. No distention.   MSK: extremities warm, well perfused. No edema.  NEURO: grossly nonfocal exam, moves all extremities. PSYCH: alert and oriented x 3, normal mood and affect.    ASSESSMENT:    1. Coronary artery disease involving native coronary artery of native heart without angina pectoris   2. Non-ST elevation (NSTEMI) myocardial infarction (HCC)   3. Chest pain, unspecified type   4. Dizziness   5. Postural dizziness with presyncope   6. Medication management   7. Essential hypertension   8. Hyperlipidemia, unspecified hyperlipidemia type   9. Aortic valve sclerosis     PLAN:    NSTEMI Chest pain -He has had PCI to the  RCA and has residual LAD disease that we are medically managing. Recent cath for chest pain shows stable CAD. -Continue aspirin 81 mg daily, atorvastatin 80 mg daily.  Dizziness Presyncope Med management - amlodipine 5 mg for optimal BP per patient preference. No symptoms - normal echo - recommend compression socks, he is compliant.  Hypertension-continue lisinopril 20mg .  Amlodipine 5 mg  Hyperlipidemia-continue atorvastatin 80 mg daily. LDL 62. Trig 170 but labs taken non-fasting.   Total time of encounter: 25 minutes total time of encounter, including 15 minutes spent in face-to-face patient care on the date of this encounter. This time includes coordination of care and counseling regarding above mentioned problem list. Remainder of non-face-to-face time involved reviewing chart documents/testing relevant to the patient encounter and documentation in the medical record. I have independently reviewed documentation from referring provider.   Cherlynn Kaiser, MD, [facility]     Medication Adjustments/Labs and Tests Ordered: Current medicines are reviewed at length with the patient today.  Concerns regarding medicines are outlined above.   Orders Placed This Encounter  Procedures   EKG 12-Lead     Meds ordered this encounter  Medications   amLODipine (NORVASC) 5 MG tablet    Sig: Take 1 tablet (5 mg total) by mouth daily.    Dispense:  90 tablet    Refill:  3     Patient Instructions  Medication Instructions:  No Changes In Medications at this time.  *If you need a refill on your cardiac medications before your next appointment, please call your pharmacy*  Follow-Up: At [facility], you and your health needs are our priority.  As part of our continuing mission to provide you with exceptional heart care, we have created designated Provider Care Teams.  These Care Teams include your primary Cardiologist (physician) and Advanced Practice Providers (APPs -  Physician Assistants and Nurse Practitioners) who all work together to provide you with the care you need, when you need it.  Your next appointment:   6 month(s)  Provider:   Elouise Munroe, MD

## 2022-11-08 DIAGNOSIS — Z1211 Encounter for screening for malignant neoplasm of colon: Secondary | ICD-10-CM | POA: Diagnosis not present

## 2022-11-08 DIAGNOSIS — K648 Other hemorrhoids: Secondary | ICD-10-CM | POA: Diagnosis not present

## 2022-11-08 DIAGNOSIS — K5730 Diverticulosis of large intestine without perforation or abscess without bleeding: Secondary | ICD-10-CM | POA: Diagnosis not present

## 2022-11-30 ENCOUNTER — Other Ambulatory Visit: Payer: Self-pay

## 2022-11-30 MED ORDER — AMLODIPINE BESYLATE 5 MG PO TABS: 5.0000 mg | ORAL_TABLET | Freq: Every day | ORAL | 3 refills | Status: DC

## 2022-12-04 DIAGNOSIS — J069 Acute upper respiratory infection, unspecified: Secondary | ICD-10-CM | POA: Diagnosis not present

## 2022-12-04 DIAGNOSIS — J302 Other seasonal allergic rhinitis: Secondary | ICD-10-CM | POA: Diagnosis not present

## 2023-01-22 ENCOUNTER — Ambulatory Visit: Payer: Medicare Other | Admitting: Allergy & Immunology

## 2023-01-28 ENCOUNTER — Ambulatory Visit: Payer: Medicare Other | Admitting: Internal Medicine

## 2023-01-28 ENCOUNTER — Encounter: Payer: Self-pay | Admitting: Internal Medicine

## 2023-01-28 ENCOUNTER — Other Ambulatory Visit: Payer: Self-pay

## 2023-01-28 VITALS — BP 142/72 | HR 63 | Temp 98.5°F | Resp 16 | Ht 69.0 in | Wt 168.4 lb

## 2023-01-28 DIAGNOSIS — J3089 Other allergic rhinitis: Secondary | ICD-10-CM | POA: Diagnosis not present

## 2023-01-28 DIAGNOSIS — J343 Hypertrophy of nasal turbinates: Secondary | ICD-10-CM

## 2023-01-28 DIAGNOSIS — K219 Gastro-esophageal reflux disease without esophagitis: Secondary | ICD-10-CM

## 2023-01-28 MED ORDER — FLUTICASONE PROPIONATE 50 MCG/ACT NA SUSP: 2.0000 | Freq: Every day | NASAL | 5 refills | Status: AC

## 2023-01-28 NOTE — Patient Instructions (Addendum)
Chronic Rhinitis  - Use nasal saline rinses before nose sprays such as with Neilmed Sinus Rinse.  Use distilled water.   - Use Flonase 2 sprays each nostril daily. Aim upward and outward. - Hold all anti-histamines for 3 days prior to next visit.  We will plan to do skin testing at the time.    GERD -Avoid lying down for at least two hours after a meal or after drinking acidic beverages, like soda, or other caffeinated beverages. This can help to prevent stomach contents from flowing back into the esophagus. -Keep your head elevated while you sleep. Using an extra pillow or two can also help to prevent reflux. -Eat smaller and more frequent meals each day instead of a few large meals. This promotes digestion and can aid in preventing heartburn. -Wear loose-fitting clothes to ease pressure on the stomach, which can worsen heartburn and reflux. -Reduce excess weight around the midsection. This can ease pressure on the stomach. Such pressure can force some stomach contents back up the esophagus.   Follow up: 02/04/2023: 8:30 AM overbook with me for skin testing

## 2023-01-28 NOTE — Progress Notes (Signed)
NEW PATIENT  Date of Service/Encounter:  01/28/23  Consult requested by: Clovis Riley, L.August Saucer, MD   Subjective:   Jerry [name] (DOB: [birth year]) is a 72 y.o. male who presents to the clinic on 01/28/2023 with a chief complaint of Allergic Rhinitis  .    History obtained from: chart review and patient.   Rhinitis:  Started about 20 years ago  It has worsened since 2018. Symptoms include: nasal congestion, rhinorrhea, and post nasal drainage fatigue, headaches Occurs seasonally-Spring Potential triggers: pollen  Treatments tried:  Flonase PRN Oral anti histamine PRN  Previous allergy testing: no History of reflux/heartburn: rarely, Tums does controls it History of sinus surgery: no Nonallergic triggers: none   Past Medical History: Past Medical History:  Diagnosis Date   High cholesterol    History of kidney stones    Hypertension    Migraine    "none since ~ 2014 or before" (05/23/2018)   Past Surgical History: Past Surgical History:  Procedure Laterality Date   CORONARY STENT INTERVENTION N/A 05/26/2018   Procedure: CORONARY STENT INTERVENTION;  Surgeon: Swaziland, Peter M, MD;  Location: [facility]VASIVE [facility];  Service: Cardiovascular;  Laterality: N/A;   CYSTOSCOPY W/ STONE MANIPULATION  2017   HERNIA REPAIR     INGUINAL HERNIA REPAIR Left 03/07/2016   Procedure: LEFT INGUINAL HERNIA REPAIR;  Surgeon: Manus Rudd, MD;  Location: [facility] OR;  Service: General;  Laterality: Left;   INSERTION OF MESH Left 03/07/2016   Procedure: INSERTION OF MESH;  Surgeon: Manus Rudd, MD;  Location: [facility] OR;  Service: General;  Laterality: Left;   LEFT HEART CATH AND CORONARY ANGIOGRAPHY N/A 05/26/2018   Procedure: LEFT HEART CATH AND CORONARY ANGIOGRAPHY;  Surgeon: Swaziland, Peter M, MD;  Location: [facility] INVASIVE CV LAB;  Service: Cardiovascular;  Laterality: N/A;   LEFT HEART CATH AND CORONARY ANGIOGRAPHY N/A 01/16/2022   Procedure: LEFT HEART CATH AND CORONARY ANGIOGRAPHY;  Surgeon: Corky Crafts, MD;  Location: [facility] INVASIVE CV LAB;  Service: Cardiovascular;  Laterality: N/A;   LITHOTRIPSY  1990s    Family History: Family History  Problem Relation Age of Onset   Hypertension Father    Allergic rhinitis Neg Hx    Angioedema Neg Hx    Asthma Neg Hx    Eczema Neg Hx    Urticaria Neg Hx     Social History:  Lives in a 41 year house Flooring in bedroom: wood Pets: none Tobacco use/exposure: pipe from 1978-2008 Job: retired   Medication List:  Allergies as of 01/28/2023       Reactions   Ciprofloxacin Other (See Comments)   Joint and muscle stiffness   Tamsulosin Other (See Comments)   Closed up nasal passages and couldn't breathe   Chloraprep One Step [chlorhexidine Gluconate] Rash   Sulfamethoxazole Hives, Itching, Rash, Swelling        Medication List        Accurate as of January 28, 2023  3:15 PM. If you have any questions, ask your nurse or doctor.          acetaminophen 650 MG CR tablet Commonly known as: TYLENOL Take 650 mg by mouth every 8 (eight) hours as needed for pain.   amLODipine 5 MG tablet Commonly known as: NORVASC Take 1 tablet (5 mg total) by mouth daily.   aspirin EC 81 MG tablet Take 1 tablet by mouth daily.   atorvastatin 80 MG tablet Commonly known as: LIPITOR Take 1 tablet (80 mg total) by  mouth daily at 6 PM.   fluticasone 50 MCG/ACT nasal spray Commonly known as: FLONASE Place 2 sprays into both nostrils daily as needed for allergies.   ketoconazole 2 % cream Commonly known as: NIZORAL Apply 1 application topically daily as needed (for rosacea).   lisinopril 20 MG tablet Commonly known as: ZESTRIL TAKE 1 TABLET ONCE DAILY.   Magnesium 250 MG Tabs Take 250 mg by mouth 2 (two) times daily.   mometasone 0.1 % ointment Commonly known as: ELOCON Apply 1 application. topically daily as needed (skin rash).   multivitamin tablet Take 1 tablet by mouth daily.   nitroGLYCERIN 0.4 MG SL tablet Commonly known  as: NITROSTAT Place 1 tablet (0.4 mg total) under the tongue every 5 (five) minutes as needed for chest pain.   OVER THE COUNTER MEDICATION Eye drops as needed for itchy dry eyes   silodosin 8 MG Caps capsule Commonly known as: RAPAFLO Take 8 mg by mouth daily with breakfast.         REVIEW OF SYSTEMS: Pertinent positives and negatives discussed in HPI.   Objective:   Physical Exam: BP (!) 142/72   Pulse 63   Temp 98.5 F (36.9 C)   Resp 16   Ht 5\' 9"  (1.753 m)   Wt 168 lb 6 oz (76.4 kg)   SpO2 96%   BMI 24.86 kg/m  Body mass index is 24.86 kg/m. GEN: alert, well developed HEENT: clear conjunctiva, TM grey and translucent; has hearing aides, nose with + inferior turbinate hypertrophy, pink nasal mucosa, slight clear rhinorrhea, no cobblestoning HEART: regular rate and rhythm, no murmur LUNGS: clear to auscultation bilaterally, no coughing, unlabored respiration ABDOMEN: soft, non distended  SKIN: no rashes or lesions  Reviewed:   11/02/2022: seen by Dr. [facility] for CAD s/p PCI, HTN, HLD, migraines .  On lisinopril, amlodipine, lipitor, ASA.   04/18/2022: seen by Urology Larocco NP for BPH with LUTS on Rapaflo, normal PSA. Also with gross hematuria, and hx of kidney stones.   07/30/2019: followed by Dr. Blanchard Kelch for cataracts, astigmatism, presbyopia.    Assessment:   1. Nasal turbinate hypertrophy   2. Other allergic rhinitis   3. Gastroesophageal reflux disease, unspecified whether esophagitis present     Plan/Recommendations:  Chronic Rhinitis  - Due to turbinate hypertrophy, seasonal symptoms and unresponsive to OTC meds, will plan to do aeroallergen testing at next visit. Unable to do today due to [facility] insurance.  - Use nasal saline rinses before nose sprays such as with Neilmed Sinus Rinse.  Use distilled water.   - Use Flonase 2 sprays each nostril daily. Aim upward and outward. - Hold all anti-histamines for 3 days prior to next visit.   We will plan to do skin testing 1-55 at the time.    GERD -Avoid lying down for at least two hours after a meal or after drinking acidic beverages, like soda, or other caffeinated beverages. This can help to prevent stomach contents from flowing back into the esophagus. -Keep your head elevated while you sleep. Using an extra pillow or two can also help to prevent reflux. -Eat smaller and more frequent meals each day instead of a few large meals. This promotes digestion and can aid in preventing heartburn. -Wear loose-fitting clothes to ease pressure on the stomach, which can worsen heartburn and reflux. -Reduce excess weight around the midsection. This can ease pressure on the stomach. Such pressure can force some stomach contents back up the esophagus.  Return in about 1 week (around 02/04/2023).  Alesia Morin, MD [facility]

## 2023-02-04 ENCOUNTER — Ambulatory Visit: Payer: Medicare Other | Admitting: Internal Medicine

## 2023-02-04 ENCOUNTER — Other Ambulatory Visit: Payer: Self-pay

## 2023-02-04 ENCOUNTER — Encounter: Payer: Self-pay | Admitting: Internal Medicine

## 2023-02-04 VITALS — BP 146/60 | HR 93 | Temp 98.7°F | Resp 16

## 2023-02-04 DIAGNOSIS — J3089 Other allergic rhinitis: Secondary | ICD-10-CM

## 2023-02-04 DIAGNOSIS — J3081 Allergic rhinitis due to animal (cat) (dog) hair and dander: Secondary | ICD-10-CM

## 2023-02-04 MED ORDER — AZELASTINE HCL 0.1 % NA SOLN: NASAL | 5 refills | Status: AC

## 2023-02-04 MED ORDER — CETIRIZINE HCL 10 MG PO TABS: 10.0000 mg | ORAL_TABLET | Freq: Every day | ORAL | 5 refills | Status: AC | PRN

## 2023-02-04 NOTE — Progress Notes (Signed)
FOLLOW UP Date of Service/Encounter:  02/04/23   Subjective:  Jerry [name] (DOB: October 14, 1950) is a 72 y.o. male who [facility] for follow up for skin testing.   History obtained from: chart review and patient. Last visit was on 01/28/2023 with me with plans for testing today.   Anti histamines held today. Doing well, not sick.   Past Medical History: Past Medical History:  Diagnosis Date   High cholesterol    History of kidney stones    Hypertension    Migraine    "none since ~ 2014 or before" (05/23/2018)    Objective:  BP (!) 146/60   Pulse 93   Temp 98.7 F (37.1 C)   Resp 16   SpO2 96%  There is no height or weight on file to calculate BMI. Physical Exam: GEN: alert, well developed HEENT: clear conjunctiva, MMM HEART: regular rate  LUNGS: no coughing, unlabored respiration SKIN: no rashes or lesions  Skin Testing:  Skin prick testing was placed, which includes aeroallergens/foods, histamine control, and saline control.  Verbal consent was obtained prior to placing test.  Patient tolerated procedure well.  Allergy testing results were read and interpreted by myself, documented by clinical staff. Adequate positive and negative control.  Positive results to:  Results discussed with patient/family.  Airborne Adult Perc - 02/04/23 1013     Time Antigen Placed 0900    Allergen Manufacturer Waynette Buttery    Location Back    Number of Test 55    1. Control-Buffer 50% Glycerol Negative    2. Control-Histamine 3+    3. Bahia Negative    4. French Southern Territories Negative    5. Johnson Negative    6. [place] Blue Negative    7. Meadow Fescue Negative    8. Perennial Rye Negative    9. Timothy Negative    10. Ragweed Mix Negative    11. Cocklebur Negative    12. Plantain,  English Negative    13. Baccharis Negative    14. Dog Fennel Negative    15. Russian Thistle Negative    16. Lamb's Quarters Negative    17. Sheep Sorrell Negative     18. Rough Pigweed Negative    19. Marsh Elder, Rough Negative    20. Mugwort, Common Negative    21. Box, Elder Negative    22. Cedar, red Negative    23. Sweet Gum Negative    24. Pecan Pollen Negative    25. Pine Mix Negative    26. Walnut, Black Pollen Negative    27. Red Mulberry Negative    28. Ash Mix Negative    29. Birch Mix Negative    30. Beech American Negative    31. Cottonwood, Guinea-Bissau Negative    32. Hickory, White Negative    33. Maple Mix Negative    34. Oak, [place] Mix Negative    35. Sycamore Eastern Negative    36. Alternaria Alternata Negative    37. Cladosporium Herbarum Negative    38. Aspergillus Mix Negative    39. Penicillium Mix Negative    40. Bipolaris Sorokiniana (Helminthosporium) Negative    41. Drechslera Spicifera (Curvularia) Negative    42. Mucor Plumbeus Negative    43. Fusarium Moniliforme Negative    44. Aureobasidium Pullulans (pullulara) Negative    45. Rhizopus Oryzae Negative    46. Botrytis Cinera Negative    47. Epicoccum Nigrum Negative    48. Phoma  Betae Negative    49. Dust Mite Mix Negative    50. Cat Hair 10,000 BAU/ml Negative    51.  Dog Epithelia Negative    52. Mixed Feathers Negative    53. Horse Epithelia Negative    54. Cockroach, German Negative    55. Tobacco Leaf Negative             Intradermal - 02/04/23 1014     Time Antigen Placed 0930    Allergen Manufacturer Waynette Buttery    Location Arm    Number of Test 16    Control Negative    Bahia Negative    French Southern Territories Negative    Johnson Negative    7 Grass Negative    Ragweed Mix Negative    Weed Mix Negative    Tree Mix Negative    Mold 1 2+    Mold 2 3+    Mold 3 3+    Mold 4 3+    Mite Mix 2+    Cat 3+    Dog 3+    Cockroach Negative              Assessment:   1. Allergic rhinitis caused by mold   2. Allergic rhinitis due to dust mite   3. Allergic rhinitis due to animal hair or dander   4. Allergic rhinitis due to insect      Plan/Recommendations:  Allergic Rhinitis: - Due to turbinate hypertrophy, seasonal symptoms and unresponsive to OTC meds, performed skin testing to identify aeroallergen triggers.   - Positive skin test 01/2023: mold, cat, dog, dust mite  - Avoidance measures discussed. - Use nasal saline rinses before nose sprays such as with Neilmed Sinus Rinse.  Use distilled water.   - Use Flonase 2 sprays each nostril daily. Aim upward and outward. - Use Azelastine 1-2 sprays each nostril twice daily as needed. Aim upward and outward. - Use Zyrtec 10 mg daily as needed for runny nose, sneezing, drainage.   - Consider allergy shots as long term control of your symptoms by teaching your immune system to be more tolerant of your allergy triggers.    Return in about 2 months (around 04/06/2023).  Alesia Morin, MD [facility]

## 2023-02-04 NOTE — Patient Instructions (Addendum)
Allergic Rhinitis:  - Positive skin test 01/2023: mold, cat, dog, dust mite  - Avoidance measures discussed. - Use nasal saline rinses before nose sprays such as with Neilmed Sinus Rinse.  Use distilled water.   - Use Flonase 2 sprays each nostril daily. Aim upward and outward. - Use Azelastine 1-2 sprays each nostril twice daily as needed. Aim upward and outward. - Use Zyrtec 10 mg daily as needed for runny nose, sneezing, drainage.   - Consider allergy shots as long term control of your symptoms by teaching your immune system to be more tolerant of your allergy triggers.   ALLERGEN AVOIDANCE MEASURES   Dust Mites Use central air conditioning and heat; and change the filter monthly.  Pleated filters work better than mesh filters.  Electrostatic filters may also be used; wash the filter monthly.  Window air conditioners may be used, but do not clean the air as well as a central air conditioner.  Change or wash the filter monthly. Keep windows closed.  Do not use attic fans.   Encase the mattress, box springs and pillows with zippered, dust proof covers. Wash the bed linens in hot water weekly.   Remove carpet, especially from the bedroom. Remove stuffed animals, throw pillows, dust ruffles, heavy drapes and other items that collect dust from the bedroom. Do not use a humidifier.   Use wood, vinyl or leather furniture instead of cloth furniture in the bedroom. Keep the indoor humidity at 30 - 40%.  Monitor with a humidity gauge.  Molds - Indoor avoidance Use air conditioning to reduce indoor humidity.  Do not use a humidifier. Keep indoor humidity at 30 - 40%.  Use a dehumidifier if needed. In the bathroom use an exhaust fan or open a window after showering.  Wipe down damp surfaces after showering.  Clean bathrooms with a mold-killing solution (diluted bleach, or products like Tilex, etc) at least once a month. In the kitchen use an exhaust fan to remove steam from cooking.  Throw away  spoiled foods immediately, and empty garbage daily.  Empty water pans below self-defrosting refrigerators frequently. Vent the clothes dryer to the outside. Limit indoor houseplants; mold grows in the dirt.  No houseplants in the bedroom. Remove carpet from the bedroom. Encase the mattress and box springs with a zippered encasing.  Molds - Outdoor avoidance Avoid being outside when the grass is being mowed, or the ground is tilled. Avoid playing in leaves, pine straw, hay, etc.  Dead plant materials contain mold. Avoid going into barns or grain storage areas. Remove leaves, clippings and compost from around the home.  Pet Dander Keep the pet out of your bedroom and restrict it to only a few rooms. Be advised that keeping the pet in only one room will not limit the allergens to that room. Don't pet, hug or kiss the pet; if you do, wash your hands with soap and water. High-efficiency particulate air (HEPA) cleaners run continuously in a bedroom or living room can reduce allergen levels over time. Regular use of a high-efficiency vacuum cleaner or a central vacuum can reduce allergen levels. Giving your pet a bath at least once a week can reduce airborne allergen.

## 2023-02-11 ENCOUNTER — Telehealth: Payer: Self-pay | Admitting: Internal Medicine

## 2023-02-11 NOTE — Telephone Encounter (Signed)
FYI:   Jerry [name] called in very upset that medications were called in to his pharmacy.  He states he did not approve this and that he wasn't part of any discussion about these medications.  He states these medications were sent to his pharmacy against his will and knowledge.  He states he did pick up Flonase but states he did not know about the other medications.  He states that he wants his chart flagged and to say no one is allowed to send any medications to the pharmacy without his approval.  He states he wants to be told about the medications before they are sent in.  He states he has a follow up coming and that this better not happen again or he will no longer come here to be seen.

## 2023-03-13 DIAGNOSIS — I2510 Atherosclerotic heart disease of native coronary artery without angina pectoris: Secondary | ICD-10-CM | POA: Diagnosis not present

## 2023-03-13 DIAGNOSIS — E7800 Pure hypercholesterolemia, unspecified: Secondary | ICD-10-CM | POA: Diagnosis not present

## 2023-03-13 DIAGNOSIS — I10 Essential (primary) hypertension: Secondary | ICD-10-CM | POA: Diagnosis not present

## 2023-04-08 ENCOUNTER — Ambulatory Visit: Payer: Medicare Other | Admitting: Internal Medicine

## 2023-04-08 ENCOUNTER — Encounter: Payer: Self-pay | Admitting: Internal Medicine

## 2023-04-08 VITALS — BP 130/68 | HR 64 | Temp 98.3°F | Resp 16 | Wt 169.5 lb

## 2023-04-08 DIAGNOSIS — J343 Hypertrophy of nasal turbinates: Secondary | ICD-10-CM

## 2023-04-08 DIAGNOSIS — J3089 Other allergic rhinitis: Secondary | ICD-10-CM

## 2023-04-08 DIAGNOSIS — J3081 Allergic rhinitis due to animal (cat) (dog) hair and dander: Secondary | ICD-10-CM

## 2023-04-08 NOTE — Patient Instructions (Addendum)
Allergic Rhinitis:  - Positive skin test 01/2023: mold, cat, dog, dust mite  - Avoidance measures discussed. - Use nasal saline rinses before nose sprays such as with Neilmed Sinus Rinse.  Use distilled water.   - If symptoms worsen, use Flonase 2 sprays each nostril daily. Aim upward and outward. Start this a couple of weeks prior to Springtime.  - Use Azelastine 1-2 sprays each nostril twice daily as needed for congestion, drainage, runny nose, sneezing. Aim upward and outward. - Use Zyrtec 10 mg daily as needed for runny nose, sneezing, drainage.   - Consider allergy shots as long term control of your symptoms by teaching your immune system to be more tolerant of your allergy triggers.

## 2023-04-08 NOTE — Progress Notes (Signed)
   FOLLOW UP Date of Service/Encounter:  04/08/23   Subjective:  Jerry [name] (DOB: 11-Feb-1951) is a 72 y.o. male who [facility] for follow up for allergic rhinoconjunctivitis.   History obtained from: chart review and patient. Last visit was with me on 01/28/2023 and at the time we did SPT that showed perennial triggers.  Started on Flonase, Azelastine, Zyrtec.   Reports since last visit, he has been doing well.  He usually only flares up in Spring.  He has has some intermittent congestion and drainage for which he uses Flonase PRN.  Also uses saline spray PRN especially on high pollen count days.  Not using Azelastine or Zyrtec much.  He does not want [place] to send prescriptions to the pharmacy since it is cheaper OTC.   Past Medical History: Past Medical History:  Diagnosis Date   High cholesterol    History of kidney stones    Hypertension    Migraine    "none since ~ 2014 or before" (05/23/2018)    Objective:  BP 130/68   Pulse 64   Temp 98.3 F (36.8 C)   Resp 16   Wt 169 lb 8 oz (76.9 kg)   SpO2 96%   BMI 25.03 kg/m  Body mass index is 25.03 kg/m. Physical Exam: GEN: alert, well developed HEENT: clear conjunctiva, TM grey and translucent, nose with mild inferior turbinate hypertrophy, pink nasal mucosa, no rhinorrhea, + cobblestoning HEART: regular rate and rhythm, no murmur LUNGS: clear to auscultation bilaterally, no coughing, unlabored respiration SKIN: no rashes or lesions    Assessment:   1. Allergic rhinitis caused by mold   2. Allergic rhinitis due to dust mite   3. Allergic rhinitis due to animal hair or dander   4. Allergic rhinitis due to insect   5. Nasal turbinate hypertrophy     Plan/Recommendations:  Allergic Rhinitis:  - Controlled with flare ups in Spring.  - Positive skin test 01/2023: mold, cat, dog, dust mite  - Avoidance measures discussed. - Use nasal saline rinses before nose sprays such  as with Neilmed Sinus Rinse.  Use distilled water.   - If symptoms worsen, use Flonase 2 sprays each nostril daily. Aim upward and outward. Start this a couple of weeks prior to Springtime.  - Use Azelastine 1-2 sprays each nostril twice daily as needed for congestion, drainage, runny nose, sneezing. Aim upward and outward. - Use Zyrtec 10 mg daily as needed for runny nose, sneezing, drainage.   - Consider allergy shots as long term control of your symptoms by teaching your immune system to be more tolerant of your allergy triggers.      Return in about 1 year (around 04/07/2024).  Alesia Morin, MD [facility]

## 2023-04-12 ENCOUNTER — Telehealth: Payer: Self-pay | Admitting: Internal Medicine

## 2023-04-12 MED ORDER — LISINOPRIL 20 MG PO TABS: 20.0000 mg | ORAL_TABLET | Freq: Every day | ORAL | 3 refills | Status: DC

## 2023-04-12 NOTE — Telephone Encounter (Signed)
*  STAT* If patient is at the pharmacy, call can be transferred to refill team.   1. Which medications need to be refilled? (please list name of each medication and dose if known) lisinopril (ZESTRIL) 20 MG tablet   2. Which pharmacy/location (including street and city if local pharmacy) is medication to be sent to?  [facility] [address] [place], [place], [place] [address]  3. Do they need a 30 day or 90 day supply? 90 Day Supply

## 2023-04-12 NOTE — Telephone Encounter (Signed)
Refills sent to preferred pharmacy.  

## 2023-04-12 NOTE — Telephone Encounter (Signed)
Returned pt call. LVM to return call 

## 2023-04-12 NOTE — Telephone Encounter (Signed)
Patient is calling because they are having issues refilling their medications via MyChart. Patient had spoke with Tech support from [employer], they stated we would be the one to fix it. Patient stated it is not only the medication we ([facility]) prescribes, it is with all his medications that [facility] as an organization prescribes. Patient doesn't have a pharmacy listed on our end. Patient states he uses the [facility] in [place]. Patient stated he may be outside when we call, his wife will probably be the one to answer the call. Please advise.

## 2023-04-12 NOTE — Addendum Note (Signed)
Addended by: Vernard Gambles on: 04/12/2023 01:45 PM   Modules accepted: Orders

## 2023-04-15 NOTE — Telephone Encounter (Signed)
Call to patient. He is frustrated which comes through the call.  He states his RX all state locked.  Did not see on our end this lock.  I was able to pull RX to send with no issues.  Also verified his preferred pharmacy while on phone.  Advised if he still has issues, he can call [phone]

## 2023-04-25 NOTE — Telephone Encounter (Signed)
Called patient - DOB verified - made sure he understood why flag was put on  - due to his request.  Patient stated he understood - requested flag removed.   Forwarding message to have flag removed.

## 2023-04-25 NOTE — Telephone Encounter (Signed)
Patient called today stating that there are medications in his MyChart that are flagged to not be refilled. The medications that are flagged are Azelastine, Cetirizine, and Fluticasone. The patient is wondering if we can unflag them so he can have the option to refill them through MyChart.

## 2023-05-06 ENCOUNTER — Telehealth: Payer: Self-pay | Admitting: Internal Medicine

## 2023-05-06 NOTE — Telephone Encounter (Signed)
Pt c/o medication issue:  1. Name of Medication:   OVER THE COUNTER MEDICATION    2. How are you currently taking this medication (dosage and times per day)?   Eye drops as needed for itchy dry eyes    3. Are you having a reaction (difficulty breathing--STAT)? no  4. What is your medication issue? Pt would like for medication to be removed from his current medication list. He would like a callback once this is done so he can make sure by looking in MyChart. Please advise

## 2023-05-06 NOTE — Telephone Encounter (Signed)
Spoke with pt regarding having medication removed due to him having issues with mychart. Medication for over the counter eye drops has been removed. Pt checks his mychart to make sure that he has access to medications however he is saying that there is a lock beside all of his medications on his list except for nitroglycerin. Unable to figure out why system has pt "locked" out of medications. Pt states that after calling last time that he has spent a long time on the phone with out IT group. Pt thanks me for my time.

## 2023-05-15 ENCOUNTER — Encounter: Payer: Self-pay | Admitting: Internal Medicine

## 2023-05-15 ENCOUNTER — Ambulatory Visit: Payer: Medicare Other | Attending: Internal Medicine | Admitting: Internal Medicine

## 2023-05-15 VITALS — BP 134/70 | HR 69 | Ht 70.0 in | Wt 165.0 lb

## 2023-05-15 DIAGNOSIS — I2510 Atherosclerotic heart disease of native coronary artery without angina pectoris: Secondary | ICD-10-CM

## 2023-05-15 DIAGNOSIS — I10 Essential (primary) hypertension: Secondary | ICD-10-CM

## 2023-05-15 DIAGNOSIS — I358 Other nonrheumatic aortic valve disorders: Secondary | ICD-10-CM | POA: Diagnosis not present

## 2023-05-15 NOTE — Patient Instructions (Signed)
Medication Instructions:  Your physician recommends that you continue on your current medications as directed. Please refer to the Current Medication list given to you today.  *If you need a refill on your cardiac medications before your next appointment, please call your pharmacy*   Testing/Procedures: Your physician has requested that you have an echocardiogram. Echocardiography is a painless test that uses sound waves to create images of your heart. It provides your doctor with information about the size and shape of your heart and how well your heart's chambers and valves are working. This procedure takes approximately one hour. There are no restrictions for this procedure. Please do NOT wear cologne, perfume, aftershave, or lotions (deodorant is allowed). Please arrive 15 minutes prior to your appointment time. This will take place at [address] **To do in March 2025**    Follow-Up: At [facility], you and your health needs are our priority.  As part of our continuing mission to provide you with exceptional heart care, we have created designated Provider Care Teams.  These Care Teams include your primary Cardiologist (physician) and Advanced Practice Providers (APPs -  Physician Assistants and Nurse Practitioners) who all work together to provide you with the care you need, when you need it.  We recommend signing up for the patient portal called "MyChart".  Sign up information is provided on this After Visit Summary.  MyChart is used to connect with patients for Virtual Visits (Telemedicine).  Patients are able to view lab/test results, encounter notes, upcoming appointments, etc.  Non-urgent messages can be sent to your provider as well.   To learn more about what you can do with MyChart, go to [email or web address].    Your next appointment:   6 month(s)  Provider:   Parke Poisson, MD

## 2023-05-15 NOTE — Progress Notes (Signed)
Cardiology Office Note:    Date:  05/15/2023   ID:  ZAE STONES, DOB 1950-11-22, MRN [id number]  PCP:  Asencion Gowda.August Saucer, MD  Cardiologist:  Parke Poisson, MD  Electrophysiologist:  None   Referring MD: Asencion Gowda.August Saucer, MD   Chief Complaint/Reason for Referral: CAD status post NSTEMI with PCI to the RCA and residual mid LAD disease medically managed  History of Present Illness:    Jerry [name] is a 72 y.o. male with history of hypertension, hyperlipidemia, migraine headaches previously on verapamil, who presents today for follow-up of NSTEMI with PCI to the RCA and evidence of residual complex LAD disease on 05/23/2018.   Feeling much better off of metoprolol. Only occasional positional dizziness. BP mildly elevated after stopping metoprolol. We had started amlodipine 5 mg daily to tightly control BP. Unfortunately he experienced chest pain concerning for ACS and code STEMI called due to slight lateral ST elevations. Cath showed stable disease and perhaps improved LAD stenosis. CP thought possibly due to hypoperfusion and amlodipine decreased to 2.5 mg daily. We discussed avoiding hypotension particularly since he is symptomatic when he is bradycardic or hypotensive, leading to medication dose adjustments over time. Patient has some concern about continued borderline elevated BP. Discussed that we could increase amlodipine on a trial basis to ensure no hypotension. This went well and he continues on amlodipine 5 mg daily per his preference.   Infrequent positional lightheadedness with bending over but rare.   Discussed the use of AI scribe software for clinical note transcription with the patient, who gave verbal consent to proceed.  History of Present Illness   The patient, with a history of hypertension and hyperlipidemia, presents for a routine follow-up. He reports that his blood pressure has been fluctuating slightly, but he has not experienced any symptoms of hypotension.  He has been actively monitoring his blood pressure at home. The patient also reports no limitations in his physical activities and maintains a healthy diet. He has been on amlodipine 5mg  daily for his hypertension and is also on lisinopril 20mg  daily. The patient has a history of a mild systolic murmur, which has been stable. He has been experiencing issues with his MyChart account, which has been preventing him from refilling his medications.       Past Medical History:  Diagnosis Date   High cholesterol    History of kidney stones    Hypertension    Migraine    "none since ~ 2014 or before" (05/23/2018)    Past Surgical History:  Procedure Laterality Date   CORONARY STENT INTERVENTION N/A 05/26/2018   Procedure: CORONARY STENT INTERVENTION;  Surgeon: Swaziland, Peter M, MD;  Location: [facility] INVASIVE CV LAB;  Service: Cardiovascular;  Laterality: N/A;   CYSTOSCOPY W/ STONE MANIPULATION  2017   HERNIA REPAIR     INGUINAL HERNIA REPAIR Left 03/07/2016   Procedure: LEFT INGUINAL HERNIA REPAIR;  Surgeon: Manus Rudd, MD;  Location: [facility] OR;  Service: General;  Laterality: Left;   INSERTION OF MESH Left 03/07/2016   Procedure: INSERTION OF MESH;  Surgeon: Manus Rudd, MD;  Location: [facility] OR;  Service: General;  Laterality: Left;   LEFT HEART CATH AND CORONARY ANGIOGRAPHY N/A 05/26/2018   Procedure: LEFT HEART CATH AND CORONARY ANGIOGRAPHY;  Surgeon: Swaziland, Peter M, MD;  Location: [facility] INVASIVE CV LAB;  Service: Cardiovascular;  Laterality: N/A;   LEFT HEART CATH AND CORONARY ANGIOGRAPHY N/A 01/16/2022   Procedure: LEFT HEART CATH AND CORONARY ANGIOGRAPHY;  Surgeon: Eldridge Dace,  Donnie Coffin, MD;  Location: [facility] INVASIVE CV LAB;  Service: Cardiovascular;  Laterality: N/A;   LITHOTRIPSY  1990s    Current Medications: Current Meds  Medication Sig   acetaminophen (TYLENOL) 650 MG CR tablet Take 650 mg by mouth every 8 (eight) hours as needed for pain.   amLODipine (NORVASC) 5 MG tablet Take 1 tablet (5 mg  total) by mouth daily.   aspirin EC 81 MG tablet Take 1 tablet by mouth daily.   azelastine (ASTELIN) 0.1 % nasal spray Place 1-2 sprays each nostril twice daily as needed   cetirizine (ZYRTEC ALLERGY) 10 MG tablet Take 1 tablet (10 mg total) by mouth daily as needed for allergies.   fluticasone (FLONASE) 50 MCG/ACT nasal spray Place 2 sprays into both nostrils daily.   ketoconazole (NIZORAL) 2 % cream Apply 1 application  topically daily as needed (for rosacea).   lisinopril (ZESTRIL) 20 MG tablet Take 1 tablet (20 mg total) by mouth daily.   Magnesium 250 MG TABS Take 250 mg by mouth 2 (two) times daily.   mometasone (ELOCON) 0.1 % ointment Apply 1 application. topically daily as needed (skin rash).   Multiple Vitamin (MULTIVITAMIN) tablet Take 1 tablet by mouth daily.   silodosin (RAPAFLO) 8 MG CAPS capsule Take 8 mg by mouth daily with breakfast.     Allergies:   Ciprofloxacin, Tamsulosin, Chloraprep one step [chlorhexidine gluconate], and Sulfamethoxazole   Social History   Tobacco Use   Smoking status: Former    Types: Pipe    Quit date: 02/28/2000    Years since quitting: 23.2   Smokeless tobacco: Never  Vaping Use   Vaping status: Never Used  Substance Use Topics   Alcohol use: Yes    Comment: occ   Drug use: Never     Family History: The patient's family history includes Hypertension in his father. There is no history of Allergic rhinitis, Angioedema, Asthma, Eczema, or Urticaria.  ROS:   Please see the history of present illness.    All other systems reviewed and are negative.  EKGs/Labs/Other Studies Reviewed:    The following studies were reviewed today: Results   LABS LDL 62 Triglycerides: 170 mg/dL (06/9146)       EKG: EKG Interpretation Date/Time:  Wednesday May 15 2023 08:48:57 EDT Ventricular Rate:  69 PR Interval:  146 QRS Duration:  90 QT Interval:  384 QTC Calculation: 411 R Axis:   5  Text Interpretation: Normal sinus rhythm Septal  infarct (cited on or before 15-May-2023) Confirmed by Weston Brass ([id number]) on 05/15/2023 9:26:40 AM   NSR, sinus arrhythmia  Recent Labs: No results found for requested labs within last 365 days.  Recent Lipid Panel    Component Value Date/Time   CHOL 126 01/16/2022 1007   TRIG 112 01/16/2022 1007   HDL 39 (L) 01/16/2022 1007   CHOLHDL 3.2 01/16/2022 1007   VLDL 22 01/16/2022 1007   LDLCALC 65 01/16/2022 1007    Physical Exam:    VS:  BP 134/70   Pulse 69   Ht 5\' 10"  (1.778 m)   Wt 165 lb (74.8 kg)   SpO2 98%   BMI 23.68 kg/m     Wt Readings from Last 5 Encounters:  05/15/23 165 lb (74.8 kg)  04/08/23 169 lb 8 oz (76.9 kg)  01/28/23 168 lb 6 oz (76.4 kg)  11/02/22 169 lb (76.7 kg)  05/03/22 170 lb (77.1 kg)    Constitutional: No acute distress Eyes: sclera non-icteric, normal  conjunctiva and lids ENMT: normal dentition, moist mucous membranes Cardiovascular: regular rhythm, normal rate, 1/6 SEM. S1 and S2 normal. Radial pulses normal bilaterally. No jugular venous distention.  Respiratory: clear to auscultation bilaterally GI : normal bowel sounds, soft and nontender. No distention.   MSK: extremities warm, well perfused. No edema.  NEURO: grossly nonfocal exam, moves all extremities. PSYCH: alert and oriented x 3, normal mood and affect.    ASSESSMENT:    1. Essential hypertension   2. Coronary artery disease involving native coronary artery of native heart without angina pectoris   3. Aortic valve sclerosis     PLAN:    NSTEMI Chest pain -He has had PCI to the RCA and has residual LAD disease that we are medically managing. Recent cath for chest pain shows stable CAD. -Continue aspirin 81 mg daily, atorvastatin 80 mg daily.  Dizziness Presyncope Med management - amlodipine 5 mg for optimal BP per patient preference. No symptoms - normal echo - recommend compression socks, he is compliant.  Assessment and Plan    Hypertension Blood pressure  readings are variable but generally acceptable. No symptoms of hypotension. Patient is on Amlodipine 5mg  daily and lisinopril 20 mg daily. -Continue current management.  Hypertriglyceridemia Triglycerides were slightly elevated at 170 in January 2024. Patient reports a low fat, low salt diet. -Check lipid panel with primary care provider in January 2025. If triglycerides remain elevated, consider medication adjustment.  Aortic Sclerosis Mild aortic murmur noted on examination, unchanged from previous. Last echocardiogram in 2022 showed aortic sclerosis. -Schedule repeat echocardiogram in approximately 6 months (March 2025) prior to next visit.  General Health Maintenance Patient reports good physical activity and balanced diet. No chest discomfort or shortness of breath with activity. -Encourage continued healthy lifestyle. -Plan follow-up visit after echocardiogram in approximately 6 months.        Total time of encounter: 25 minutes total time of encounter, including 15 minutes spent in face-to-face patient care on the date of this encounter. This time includes coordination of care and counseling regarding above mentioned problem list. Remainder of non-face-to-face time involved reviewing chart documents/testing relevant to the patient encounter and documentation in the medical record. I have independently reviewed documentation from referring provider.   Weston Brass, MD, [facility]  [facility]     Medication Adjustments/Labs and Tests Ordered: Current medicines are reviewed at length with the patient today.  Concerns regarding medicines are outlined above.   Orders Placed This Encounter  Procedures   EKG 12-Lead   ECHOCARDIOGRAM COMPLETE     No orders of the defined types were placed in this encounter.    Patient Instructions  Medication Instructions:  Your physician recommends that you continue on your current medications as directed. Please refer to the  Current Medication list given to you today.  *If you need a refill on your cardiac medications before your next appointment, please call your pharmacy*   Testing/Procedures: Your physician has requested that you have an echocardiogram. Echocardiography is a painless test that uses sound waves to create images of your heart. It provides your doctor with information about the size and shape of your heart and how well your heart's chambers and valves are working. This procedure takes approximately one hour. There are no restrictions for this procedure. Please do NOT wear cologne, perfume, aftershave, or lotions (deodorant is allowed). Please arrive 15 minutes prior to your appointment time. This will take place at [address] **To do  in March 2025**    Follow-Up: At [facility], you and your health needs are our priority.  As part of our continuing mission to provide you with exceptional heart care, we have created designated Provider Care Teams.  These Care Teams include your primary Cardiologist (physician) and Advanced Practice Providers (APPs -  Physician Assistants and Nurse Practitioners) who all work together to provide you with the care you need, when you need it.  We recommend signing up for the patient portal called "MyChart".  Sign up information is provided on this After Visit Summary.  MyChart is used to connect with patients for Virtual Visits (Telemedicine).  Patients are able to view lab/test results, encounter notes, upcoming appointments, etc.  Non-urgent messages can be sent to your provider as well.   To learn more about what you can do with MyChart, go to [email or web address].    Your next appointment:   6 month(s)  Provider:   Parke Poisson, MD

## 2023-06-23 ENCOUNTER — Other Ambulatory Visit: Payer: Self-pay | Admitting: Urology

## 2023-07-01 DIAGNOSIS — Z87442 Personal history of urinary calculi: Secondary | ICD-10-CM | POA: Diagnosis not present

## 2023-07-04 ENCOUNTER — Other Ambulatory Visit: Payer: Self-pay | Admitting: Urology

## 2023-08-26 ENCOUNTER — Telehealth: Payer: Self-pay

## 2023-08-26 ENCOUNTER — Encounter: Payer: Self-pay | Admitting: Internal Medicine

## 2023-08-26 NOTE — Telephone Encounter (Signed)
 Called and spoke with patient. He states is having intermittent episodes of heart racing at night. He also states occaisional light chest pressure that resolves on own without taking nitroglycerin. Appt mad with MD for this Friday 1/10 at 0800.

## 2023-08-30 ENCOUNTER — Encounter: Payer: Self-pay | Admitting: Internal Medicine

## 2023-08-30 ENCOUNTER — Ambulatory Visit: Payer: Medicare Other | Attending: Internal Medicine | Admitting: Internal Medicine

## 2023-08-30 ENCOUNTER — Ambulatory Visit: Payer: Medicare Other | Attending: Internal Medicine

## 2023-08-30 VITALS — BP 128/80 | HR 63 | Ht 70.0 in | Wt 166.0 lb

## 2023-08-30 DIAGNOSIS — R002 Palpitations: Secondary | ICD-10-CM

## 2023-08-30 DIAGNOSIS — R079 Chest pain, unspecified: Secondary | ICD-10-CM | POA: Diagnosis not present

## 2023-08-30 DIAGNOSIS — I2510 Atherosclerotic heart disease of native coronary artery without angina pectoris: Secondary | ICD-10-CM | POA: Diagnosis not present

## 2023-08-30 DIAGNOSIS — I358 Other nonrheumatic aortic valve disorders: Secondary | ICD-10-CM

## 2023-08-30 DIAGNOSIS — R42 Dizziness and giddiness: Secondary | ICD-10-CM

## 2023-08-30 DIAGNOSIS — I214 Non-ST elevation (NSTEMI) myocardial infarction: Secondary | ICD-10-CM

## 2023-08-30 DIAGNOSIS — I10 Essential (primary) hypertension: Secondary | ICD-10-CM

## 2023-08-30 DIAGNOSIS — R55 Syncope and collapse: Secondary | ICD-10-CM

## 2023-08-30 NOTE — Progress Notes (Unsigned)
 Enrolled patient for a 14 day Zio XT  monitor to be mailed to patients home

## 2023-08-30 NOTE — Patient Instructions (Addendum)
 Medication Instructions:  Your physician recommends that you continue on your current medications as directed. Please refer to the Current Medication list given to you today.  *If you need a refill on your cardiac medications before your next appointment, please call your pharmacy*  Lab Work: None  Testing: Your physician has requested that you have an echocardiogram.  This is scheduled for 10/22/2023 (in [place]), but we need to reschedule to have it done sooner.  Echocardiography is a painless test that uses sound waves to create images of your heart. It provides your doctor with information about the size and shape of your heart and how well your heart's chambers and valves are working. This procedure takes approximately one hour. There are no restrictions for this procedure. Please do NOT wear cologne, perfume, aftershave, or lotions (deodorant is allowed). Please arrive 15 minutes prior to your appointment time.   ZIO XT- Long Term Monitor Instructions  Your physician has requested you wear a ZIO patch monitor for 14 days.  This is a single patch monitor. [employer] supplies one patch monitor per enrollment. Additional stickers are not available. Please do not apply patch if you will be having a Nuclear Stress Test,  Echocardiogram, Cardiac CT, MRI, or Chest Xray during the period you would be wearing the  monitor. The patch cannot be worn during these tests. You cannot remove and re-apply the  ZIO XT patch monitor.  Your ZIO patch monitor will be mailed 3 day USPS to your address on file. It may take 3-5 days  to receive your monitor after you have been enrolled.  Once you have received your monitor, please review the enclosed instructions. Your monitor  has already been registered assigning a specific monitor serial # to you.  Billing and Patient Assistance Program Information  We have supplied [employer] with any of your insurance information on file for billing purposes. [employer] offers a  sliding scale Patient Assistance Program for patients that do not have  insurance, or whose insurance does not completely cover the cost of the ZIO monitor.  You must apply for the Patient Assistance Program to qualify for this discounted rate.  To apply, please call [employer] at [phone], select option 4, select option 2, ask to apply for  Patient Assistance Program. [employer] will ask your household income, and how many people  are in your household. They will quote your out-of-pocket cost based on that information.  [employer] will also be able to set up a 79-month, interest-free payment plan if needed.  Applying the monitor   Shave hair from upper left chest.  Hold abrader disc by orange tab. Rub abrader in 40 strokes over the upper left chest as  indicated in your monitor instructions.  Clean area with 4 enclosed alcohol pads. Let dry.  Apply patch as indicated in monitor instructions. Patch will be placed under collarbone on left  side of chest with arrow pointing upward.  Rub patch adhesive wings for 2 minutes. Remove white label marked 1. Remove the white  label marked 2. Rub patch adhesive wings for 2 additional minutes.  While looking in a mirror, press and release button in center of patch. A small green light will  flash 3-4 times. This will be your only indicator that the monitor has been turned on.  Do not shower for the first 24 hours. You may shower after the first 24 hours.  Press the button if you feel a symptom. You will hear a small click. Record Date,  Time and  Symptom in the Patient Logbook.  When you are ready to remove the patch, follow instructions on the last 2 pages of Patient  Logbook. Stick patch monitor onto the last page of Patient Logbook.  Place Patient Logbook in the blue and white box. Use locking tab on box and tape box closed  securely. The blue and white box has prepaid postage on it. Please place it in the mailbox as  soon as possible. Your physician  should have your test results approximately 7 days after the  monitor has been mailed back to [facility].  Call [facility] Customer Care at [phone] if you have questions regarding  your ZIO XT patch monitor. Call them immediately if you see an orange light blinking on your  monitor.  If your monitor falls off in less than 4 days, contact our Monitor department at [phone].  If your monitor becomes loose or falls off after 4 days call [employer] at [phone] for  suggestions on securing your monitor   Follow-Up: At [facility], you and your health needs are our priority.  As part of our continuing mission to provide you with exceptional heart care, we have created designated Provider Care Teams.  These Care Teams include your primary Cardiologist (physician) and Advanced Practice Providers (APPs -  Physician Assistants and Nurse Practitioners) who all work together to provide you with the care you need, when you need it.  Your next appointment:   11/05/23 @ 9:20 am  Provider:   Gayatri A Acharya, MD

## 2023-08-30 NOTE — Progress Notes (Signed)
 Cardiology Office Note:  .   Date:  08/30/2023  ID:  Jerry [name], DOB [birth year], MRN [id number] PCP: Merilee CROME.Addie, MD (Inactive)  [facility] Providers Cardiologist:  Soyla DELENA Merck, MD    History of Present Illness: .   Jerry [name] is a 73 y.o. male.  Discussed the use of AI scribe software for clinical note transcription with the patient, who gave verbal consent to proceed.  History of Present Illness   The patient, with a history of CAD and aortic valve sclerosis, presents with palpitations and chest pain, particularly when lying on his left side. The palpitations are described as a racing heart, which the patient can feel in his chest but not in his pulse. The chest pain is described as minor and intermittent, occurring on and off since the end of December. The patient also reports fatigue and tingling in the fingers, which he attributes to his sleeping position. The patient has not experienced any fainting or lightheadedness. The patient's symptoms seem to be worse at night and upon waking, and he has taken measures such as using a pillow to prevent turning onto his left side during sleep. The patient has not noticed any correlation between his symptoms and his caffeine intake, which he has recently reduced. The patient's blood pressure has been in the 140s systolic and 70s diastolic when he has experienced chest pain.        ROS: negative except per HPI above.  Studies Reviewed: SABRA   EKG Interpretation Date/Time:  Friday August 30 2023 08:11:48 EST Ventricular Rate:  63 PR Interval:  152 QRS Duration:  88 QT Interval:  396 QTC Calculation: 405 R Axis:   -5  Text Interpretation: Normal sinus rhythm Normal ECG When compared with ECG of 15-May-2023 08:48, Criteria for Septal infarct are no longer Present Confirmed by Merck Soyla ([id number]) on 08/30/2023 8:20:37 AM    Results   DIAGNOSTIC Cardiac catheterization: Patent RCA stent, no worsening of the  anatomy     Risk Assessment/Calculations:    Physical Exam:   VS:  BP 128/80 (BP Location: Left Arm, Patient Position: Sitting, Cuff Size: Normal)   Pulse 63   Ht 5' 10 (1.778 m)   Wt 166 lb (75.3 kg)   SpO2 95%   BMI 23.82 kg/m    Wt Readings from Last 3 Encounters:  08/30/23 166 lb (75.3 kg)  05/15/23 165 lb (74.8 kg)  04/08/23 169 lb 8 oz (76.9 kg)     Physical Exam   VITALS: BP- 128/80, P- 60     GEN: Well nourished, well developed in no acute distress NECK: No JVD; No carotid bruits CARDIAC: RRR, 1/6 systolic murmur RESPIRATORY:  Clear to auscultation without rales, wheezing or rhonchi  ABDOMEN: Soft, non-tender, non-distended EXTREMITIES:  No edema; No deformity   ASSESSMENT AND PLAN: .    Assessment & Plan Chest pain, unspecified type  Palpitations  Essential hypertension  Coronary artery disease involving native coronary artery of native heart without angina pectoris  Aortic valve sclerosis  Non-ST elevation (NSTEMI) myocardial infarction (HCC)  Dizziness  Postural dizziness with presyncope   Assessment and Plan    Palpitations Reports of heart racing, particularly when lying on left side, associated with intermittent chest pain. No history of atrial fibrillation. No recent fainting or lightheadedness, unlike prior when hypotensive.. -Order a 2-week cardiac monitor to assess for possible arrhythmias.  Chest Pain Intermittent chest pain, often associated with palpitations. Last episode on January 2nd.  No severe episodes recently. Blood pressure controlled and within normal range. -Advise patient to take nitroglycerin  for chest pain episodes if BP normal. -If severe chest pain occurs, advise patient to go to the ER. -Check blood pressure during episodes of chest pain, if possible.  Heart Murmur Known systolic murmur from aortic valve sclerosis, will move up echo. -Schedule an echocardiogram   Medication Management Currently off Metoprolol .  Blood pressure controlled. -No changes to current medications at this time.                I spent 30 minutes in the care of Jerry [name] today including reviewing studies (echo 04/20/21, cath 01/16/22), face to face time discussing treatment options (23), and documenting in the encounter.

## 2023-09-02 DIAGNOSIS — R002 Palpitations: Secondary | ICD-10-CM

## 2023-09-20 ENCOUNTER — Telehealth: Payer: Self-pay | Admitting: Internal Medicine

## 2023-09-20 NOTE — Telephone Encounter (Signed)
Montel Clock with [employer] is calling to report abnormal zio results.  Call transferred to triage.

## 2023-09-20 NOTE — Telephone Encounter (Signed)
Patient identification verified by 2 forms. Shade Flood, RN      [employer] reported: Symptomatic Bradycardia 40bpm for 30 seconds on 09/13/2023 at 3:00am. Strip 9 pg. 21   Called and spoke to patient. Provided information from zio report alert. Patient states: - still continuing to have palpitations that sometimes wake him up out of his sleep.   Patient denies:  - symptoms at time of zio report on 1/24 at 3am. He slept through the night.  - No SOB, chest pain, dizziness, blurred vision at this time or during reported alert.              Interventions/Plan: - Report taken to DOD  for review. Patient to remain on current medications and keep next appointment with Dr. Jacques Navy No changes or further recommendations at this time.  - Report under media in chart.   Reviewed ED warning signs/precautions  Patient agrees with plan, no questions at this time

## 2023-10-02 ENCOUNTER — Ambulatory Visit (HOSPITAL_COMMUNITY)
Admission: RE | Admit: 2023-10-02 | Discharge: 2023-10-02 | Disposition: A | Discharge status: Home / Self Care | Payer: Medicare Other | Source: Ambulatory Visit | Attending: Cardiovascular Disease | Admitting: Cardiovascular Disease

## 2023-10-02 DIAGNOSIS — I2510 Atherosclerotic heart disease of native coronary artery without angina pectoris: Secondary | ICD-10-CM | POA: Diagnosis not present

## 2023-10-02 DIAGNOSIS — I10 Essential (primary) hypertension: Secondary | ICD-10-CM | POA: Diagnosis not present

## 2023-10-02 DIAGNOSIS — I358 Other nonrheumatic aortic valve disorders: Secondary | ICD-10-CM | POA: Diagnosis not present

## 2023-10-02 LAB — ECHOCARDIOGRAM COMPLETE
AR max vel: 2.17 cm2
AV Area VTI: 2.15 cm2
AV Area mean vel: 2.06 cm2
AV Mean grad: 8 mm[Hg]
AV Peak grad: 15.8 mm[Hg]
Ao pk vel: 1.99 m/s
Area-P 1/2: 2.91 cm2
MV M vel: 1.03 m/s
MV Peak grad: 4.2 mm[Hg]
S' Lateral: 3.27 cm

## 2023-10-20 ENCOUNTER — Encounter: Payer: Self-pay | Admitting: Internal Medicine

## 2023-10-21 ENCOUNTER — Other Ambulatory Visit (HOSPITAL_COMMUNITY): Payer: Medicare Other

## 2023-10-22 ENCOUNTER — Encounter: Payer: Self-pay | Admitting: *Deleted

## 2023-10-22 ENCOUNTER — Other Ambulatory Visit: Payer: Medicare Other

## 2023-10-22 ENCOUNTER — Encounter (INDEPENDENT_AMBULATORY_CARE_PROVIDER_SITE_OTHER): Payer: Self-pay | Admitting: Otolaryngology

## 2023-10-22 DIAGNOSIS — R491 Aphonia: Secondary | ICD-10-CM | POA: Diagnosis not present

## 2023-11-05 ENCOUNTER — Ambulatory Visit: Payer: Medicare Other | Attending: Internal Medicine | Admitting: Internal Medicine

## 2023-11-05 ENCOUNTER — Encounter: Payer: Self-pay | Admitting: Internal Medicine

## 2023-11-05 ENCOUNTER — Ambulatory Visit: Payer: Medicare Other | Admitting: Internal Medicine

## 2023-11-05 VITALS — BP 122/60 | HR 65 | Ht 70.0 in | Wt 160.2 lb

## 2023-11-05 DIAGNOSIS — R0683 Snoring: Secondary | ICD-10-CM | POA: Diagnosis not present

## 2023-11-05 DIAGNOSIS — R002 Palpitations: Secondary | ICD-10-CM | POA: Diagnosis not present

## 2023-11-05 DIAGNOSIS — I358 Other nonrheumatic aortic valve disorders: Secondary | ICD-10-CM

## 2023-11-05 DIAGNOSIS — I10 Essential (primary) hypertension: Secondary | ICD-10-CM

## 2023-11-05 DIAGNOSIS — I25119 Atherosclerotic heart disease of native coronary artery with unspecified angina pectoris: Secondary | ICD-10-CM | POA: Diagnosis not present

## 2023-11-05 DIAGNOSIS — R079 Chest pain, unspecified: Secondary | ICD-10-CM | POA: Diagnosis not present

## 2023-11-05 DIAGNOSIS — I252 Old myocardial infarction: Secondary | ICD-10-CM

## 2023-11-05 DIAGNOSIS — G4719 Other hypersomnia: Secondary | ICD-10-CM

## 2023-11-05 LAB — TSH: TSH: <0.005 u[IU]/mL — ABNORMAL LOW (ref 0.450–4.500)

## 2023-11-05 MED ORDER — NITROGLYCERIN 0.4 MG SL SUBL
0.4000 mg | SUBLINGUAL_TABLET | SUBLINGUAL | 3 refills | Status: AC | PRN
Start: 2023-11-05 — End: 2024-06-23

## 2023-11-05 NOTE — Patient Instructions (Signed)
 Medication Instructions:  No Changes  Lab Work: TSH Today If you have labs (blood work) drawn today and your tests are completely normal, you will receive your results only by: MyChart Message (if you have MyChart) OR A paper copy in the mail If you have any lab test that is abnormal or we need to change your treatment, we will call you to review the results.   Follow-Up: At [facility], you and your health needs are our priority.  As part of our continuing mission to provide you with exceptional heart care, we have created designated Provider Care Teams.  These Care Teams include your primary Cardiologist (physician) and Advanced Practice Providers (APPs -  Physician Assistants and Nurse Practitioners) who all work together to provide you with the care you need, when you need it.   Your next appointment:   6 month(s)  Provider:   Parke Poisson, MD

## 2023-11-05 NOTE — Progress Notes (Signed)
 Cardiology Office Note:  .   Date:  11/05/2023  ID:  Jerry [name], DOB 1951/03/28, MRN [id number] PCP: Irven Coe, MD  [facility] Providers Cardiologist:  Parke Poisson, MD    History of Present Illness: .   Jerry [name] is a 73 y.o. male with a history of hypertension, hyperlipidemia, migraine headaches previously on verapamil but with excessive bradycardia hence stopped, and history of NSTEMI with PCI to the RCA with evidence of residual complex LAD disease, NSTEMI was May 23, 2018.  Patient comes in because he has been experiencing heart racing while laying on his left side, after which she feels fatigued and occasionally mild chest pain.  He has mild heart racing when laying on his right side and none when laying on his back.  He does note snoring while laying on his back.  He notes excessive daytime fatigue as well.  This all began around December 2024.  We completed a cardiac monitor given these concerns and note symptoms primarily associated with infrequent ectopy, nocturnal bradycardia is present and otherwise sinus rhythm noted.  No malignant arrhythmia, no atrial fibrillation.  Echocardiogram overall stable, and is grossly normal including normal left atrial strain.  Continued evidence of aortic valve sclerosis without stenosis.  Patient has never had a sleep study, wife does note snoring.  He has excessive daytime sleepiness.  Cardiac monitor strips reviewed in real-time with the patient and wife in the room, at times there is the appearance of an acceleration of the rhythm, even at night.  I query whether this may represent atrial tachycardia.  In the setting of possible sleep apnea, this seems plausible.  ROS: Per HPI  Studies Reviewed: Marland Kitchen   EKG Interpretation Date/Time:  Tuesday November 05 2023 09:21:53 EDT Ventricular Rate:  65 PR Interval:  146 QRS Duration:  80 QT Interval:  400 QTC Calculation: 416 R Axis:   -5  Text Interpretation: Normal sinus  rhythm Nonspecific ST and T wave abnormality When compared with ECG of 30-Aug-2023 08:11, No significant change was found Confirmed by Weston Brass ([id number]) on 11/05/2023 10:25:10 AM     Risk Assessment/Calculations:             Physical Exam:   VS:  BP 122/60   Pulse 65   Ht 5\' 10"  (1.778 m)   Wt 160 lb 3.2 oz (72.7 kg)   SpO2 96%   BMI 22.99 kg/m    Wt Readings from Last 3 Encounters:  11/05/23 160 lb 3.2 oz (72.7 kg)  08/30/23 166 lb (75.3 kg)  05/15/23 165 lb (74.8 kg)    GEN: Well nourished, well developed in no acute distress NECK: No JVD; No carotid bruits CARDIAC: RRR, no murmurs, rubs, gallops RESPIRATORY:  Clear to auscultation without rales, wheezing or rhonchi  ABDOMEN: Soft, non-tender, non-distended EXTREMITIES:  No edema; No deformity   ASSESSMENT AND PLAN: .    Palpitations Reports of heart racing, particularly when lying on left side, associated with intermittent chest pain. No history of atrial fibrillation. No recent fainting or lightheadedness, unlike prior when hypotensive. -2-week monitor was unrevealing for a source, occasional accelerations and rate, query whether this represents possible atrial runs.  Symptoms do wake him from sleep.  He does however have nocturnal bradycardia and has previously not tolerated metoprolol or other AV nodal blocking agents due to bradycardia.  Would observe for now and address possible sleep apnea. - check TSH today.   Snoring Excessive daytime fatigue/sleepiness -Patient does  snore when laying on his back, query whether his symptoms altogether represent untreated sleep apnea.  Will refer to pulmonary medicine for sleep study.  If no sleep apnea, can consider referral to EP for further evaluation of symptomatic palpitations with no malignant etiology noted, can consider loop recorder at that time.   Chest Pain Intermittent chest pain, often associated with palpitations. No severe episodes recently. Blood pressure  controlled and within normal range. -Advise patient to take nitroglycerin for chest pain episodes if BP normal. -If severe chest pain occurs, advise patient to go to the ER. -Check blood pressure during episodes of chest pain, if possible.   Systolic murmur Known systolic murmur from aortic valve sclerosis, echo with no significant changes         Addendum: TSH very low, suggestive of hyperthyroidism. Recommend PCP visit recommended to workup. Communicated findings to patient.   Signed, Parke Poisson, MD

## 2023-11-07 ENCOUNTER — Encounter: Payer: Self-pay | Admitting: Internal Medicine

## 2023-11-18 DIAGNOSIS — E0590 Thyrotoxicosis, unspecified without thyrotoxic crisis or storm: Secondary | ICD-10-CM | POA: Diagnosis not present

## 2023-11-25 ENCOUNTER — Other Ambulatory Visit: Payer: Self-pay

## 2023-11-25 MED ORDER — AMLODIPINE BESYLATE 5 MG PO TABS: 5.0000 mg | ORAL_TABLET | Freq: Every day | ORAL | 3 refills | Status: AC

## 2023-12-23 DIAGNOSIS — E061 Subacute thyroiditis: Secondary | ICD-10-CM | POA: Diagnosis not present

## 2024-01-10 ENCOUNTER — Institutional Professional Consult (permissible substitution) (INDEPENDENT_AMBULATORY_CARE_PROVIDER_SITE_OTHER): Admitting: Otolaryngology

## 2024-01-30 DIAGNOSIS — M79671 Pain in right foot: Secondary | ICD-10-CM | POA: Diagnosis not present

## 2024-01-30 DIAGNOSIS — I781 Nevus, non-neoplastic: Secondary | ICD-10-CM | POA: Diagnosis not present

## 2024-01-30 DIAGNOSIS — D2371 Other benign neoplasm of skin of right lower limb, including hip: Secondary | ICD-10-CM | POA: Diagnosis not present

## 2024-03-03 DIAGNOSIS — E061 Subacute thyroiditis: Secondary | ICD-10-CM | POA: Diagnosis not present

## 2024-03-03 DIAGNOSIS — I10 Essential (primary) hypertension: Secondary | ICD-10-CM | POA: Diagnosis not present

## 2024-03-03 DIAGNOSIS — E7800 Pure hypercholesterolemia, unspecified: Secondary | ICD-10-CM | POA: Diagnosis not present

## 2024-03-17 DIAGNOSIS — I10 Essential (primary) hypertension: Secondary | ICD-10-CM | POA: Diagnosis not present

## 2024-03-17 DIAGNOSIS — E7800 Pure hypercholesterolemia, unspecified: Secondary | ICD-10-CM | POA: Diagnosis not present

## 2024-03-17 DIAGNOSIS — R7301 Impaired fasting glucose: Secondary | ICD-10-CM | POA: Diagnosis not present

## 2024-03-18 DIAGNOSIS — I2510 Atherosclerotic heart disease of native coronary artery without angina pectoris: Secondary | ICD-10-CM | POA: Diagnosis not present

## 2024-03-18 DIAGNOSIS — D696 Thrombocytopenia, unspecified: Secondary | ICD-10-CM | POA: Diagnosis not present

## 2024-03-18 DIAGNOSIS — I358 Other nonrheumatic aortic valve disorders: Secondary | ICD-10-CM | POA: Diagnosis not present

## 2024-03-18 DIAGNOSIS — L719 Rosacea, unspecified: Secondary | ICD-10-CM | POA: Diagnosis not present

## 2024-03-18 DIAGNOSIS — E7800 Pure hypercholesterolemia, unspecified: Secondary | ICD-10-CM | POA: Diagnosis not present

## 2024-03-18 DIAGNOSIS — I10 Essential (primary) hypertension: Secondary | ICD-10-CM | POA: Diagnosis not present

## 2024-04-06 ENCOUNTER — Other Ambulatory Visit: Payer: Self-pay | Admitting: Internal Medicine

## 2024-04-07 ENCOUNTER — Telehealth: Payer: Self-pay | Admitting: Internal Medicine

## 2024-04-07 MED ORDER — LISINOPRIL 20 MG PO TABS: 20.0000 mg | ORAL_TABLET | Freq: Every day | ORAL | 2 refills | Status: AC

## 2024-04-07 NOTE — Telephone Encounter (Signed)
 RX sent in

## 2024-04-07 NOTE — Telephone Encounter (Signed)
*  STAT* If patient is at the pharmacy, call can be transferred to refill team.   1. Which medications need to be refilled? (please list name of each medication and dose if known)   lisinopril  (ZESTRIL ) 20 MG tablet     4. Which pharmacy/location (including street and city if local pharmacy) is medication to be sent to? [name] - [place], [place] - [address]     5. Do they need a 30 day or 90 day supply?   90

## 2024-04-17 DIAGNOSIS — I10 Essential (primary) hypertension: Secondary | ICD-10-CM | POA: Diagnosis not present

## 2024-05-06 ENCOUNTER — Encounter: Payer: Self-pay | Admitting: *Deleted

## 2024-05-08 ENCOUNTER — Ambulatory Visit: Attending: Internal Medicine | Admitting: Internal Medicine

## 2024-05-08 VITALS — BP 134/66 | HR 78 | Ht 70.0 in | Wt 169.0 lb

## 2024-05-08 DIAGNOSIS — I10 Essential (primary) hypertension: Secondary | ICD-10-CM

## 2024-05-08 DIAGNOSIS — I214 Non-ST elevation (NSTEMI) myocardial infarction: Secondary | ICD-10-CM | POA: Diagnosis not present

## 2024-05-08 DIAGNOSIS — I25119 Atherosclerotic heart disease of native coronary artery with unspecified angina pectoris: Secondary | ICD-10-CM

## 2024-05-08 DIAGNOSIS — R002 Palpitations: Secondary | ICD-10-CM | POA: Diagnosis not present

## 2024-05-08 DIAGNOSIS — I358 Other nonrheumatic aortic valve disorders: Secondary | ICD-10-CM

## 2024-05-08 MED ORDER — ATORVASTATIN CALCIUM 80 MG PO TABS: 80.0000 mg | ORAL_TABLET | Freq: Every day | ORAL | 3 refills | Status: AC

## 2024-05-08 NOTE — Patient Instructions (Signed)
 Medication Instructions:  Continue all medications *If you need a refill on your cardiac medications before your next appointment, please call your pharmacy*  Lab Work: None ordered  Testing/Procedures: None ordered  Follow-Up: At [facility], you and your health needs are our priority.  As part of our continuing mission to provide you with exceptional heart care, our providers are all part of one team.  This team includes your primary Cardiologist (physician) and Advanced Practice Providers or APPs (Physician Assistants and Nurse Practitioners) who all work together to provide you with the care you need, when you need it.  Your next appointment:  6 months   Call in Nov to schedule March appointment     Provider:  Dr.Acharya    We recommend signing up for the patient portal called MyChart.  Sign up information is provided on this After Visit Summary.  MyChart is used to connect with patients for Virtual Visits (Telemedicine).  Patients are able to view lab/test results, encounter notes, upcoming appointments, etc.  Non-urgent messages can be sent to your provider as well.   To learn more about what you can do with MyChart, go to [email or web address]

## 2024-05-08 NOTE — Progress Notes (Signed)
 Cardiology Office Note:  .   Date:  05/08/2024  ID:  Jerry [name], DOB [birth year], MRN [id number] PCP: Leonel Cole, MD  [facility] Providers Cardiologist:  Soyla DELENA Merck, MD    History of Present Illness: .   Jerry [name] is a 73 y.o. male.  Discussed the use of AI scribe software for clinical note transcription with the patient, who gave verbal consent to proceed.  History of Present Illness Jerry [name] is a 73 year old male with coronary artery disease who presents for follow-up of palpitations and thyroid  issues. He is accompanied by his wife, Devere.  He experiences palpitations that have become milder since thyroid  disease addressed. These occur mostly upon waking or during nighttime bathroom trips and are described as 'right side, left side dependent'. He has a history of coronary artery disease and a past heart attack. Blood pressure is generally well-controlled, with occasional spikes to 140 mmHg. There are no recent episodes of dizziness, fainting, chest pain, or shortness of breath. He has resumed physical activity, including walking three miles, which he finds manageable. Sleep has improved with reduced snoring and no disturbances.    ROS: negative except per HPI above.  Studies Reviewed: SABRA   EKG Interpretation Date/Time:  Friday May 08 2024 09:07:13 EDT Ventricular Rate:  56 PR Interval:  158 QRS Duration:  92 QT Interval:  412 QTC Calculation: 397 R Axis:   18  Text Interpretation: Sinus bradycardia When compared with ECG of 05-Nov-2023 09:21, No significant change was found Confirmed by Merck Soyla ([id number]) on 05/08/2024 9:50:53 AM    Results LABS Cholesterol: Normal (02/2024) Hemoglobin: Normal (02/2024)  DIAGNOSTIC EKG: Normal (05/08/2024) Echocardiogram: Normal (09/2023) Risk Assessment/Calculations:       Physical Exam:   VS:  BP 134/66 (BP Location: Right Arm, Cuff Size: Normal)   Pulse 78   Ht 5' 10 (1.778 m)    Wt 169 lb (76.7 kg)   SpO2 98%   BMI 24.25 kg/m    Wt Readings from Last 3 Encounters:  05/08/24 169 lb (76.7 kg)  11/05/23 160 lb 3.2 oz (72.7 kg)  08/30/23 166 lb (75.3 kg)     Physical Exam GENERAL: Alert, cooperative, well developed, no acute distress HEENT: Normocephalic, normal oropharynx, moist mucous membranes CHEST: Clear to auscultation bilaterally, no wheezes, rhonchi, or crackles CARDIOVASCULAR: Heart rate slightly slow, faint systolic heart murmur, S1 and S2 normal ABDOMEN: Soft, non-tender, non-distended, without organomegaly, normal bowel sounds EXTREMITIES: No cyanosis or edema NEUROLOGICAL: Cranial nerves grossly intact, moves all extremities without gross motor or sensory deficit   ASSESSMENT AND PLAN: .    Assessment and Plan Assessment & Plan Atherosclerotic heart disease of native coronary artery History of myocardial infarction. No current angina or dyspnea. EKG shows bradycardia, consistent with previous findings. - Continue current management with aa 81 mg daily and atorvastatin  80 mg daily.   Palpitations in the context of prior hyperthyroidism and atherosclerotic heart disease Palpitations milder since hyperthyroidism resolution. No significant chest discomfort. Likely driven by thyroid  disease, expected to improve. - Consider reintroducing caffeine gradually, starting with half decaf and half regular coffee. - Monitor palpitations and report if he worsens.  Aortic valve sclerosis Faint heart murmur consistent with aortic valve sclerosis. - Continue monitoring with regular follow-up.  Essential hypertension Blood pressure well-controlled, averaging 120-130/60-70 mmHg. Occasional spikes to 140 mmHg. Current management effective. - Continue current blood pressure management with amlodipine  5 mg daily and lisiniopril 20 mg daily.  -  Monitor blood pressure regularly.      Soyla Merck, MD, FACC

## 2024-05-12 ENCOUNTER — Telehealth: Payer: Self-pay | Admitting: Urology

## 2024-05-12 NOTE — Telephone Encounter (Signed)
 Patient called into the office today with general questions/concerns regarding kidney stone. He has been in pain and has been vomiting from kidney stone issue. Wants to know if he can be seen or do anything before November appt Patient may be reached at [phone] to discuss questions.

## 2024-05-13 NOTE — Telephone Encounter (Signed)
 Called pt and lvm stating that we have added him to the waitlist to see MD Dahlstedt as well as he needs to f/u w/ his PCP as soon as possible for kidney stone issues due to him not seeing a provider from our office as of yet advised to c/b w/ any questions or concerns

## 2024-05-15 DIAGNOSIS — Z87442 Personal history of urinary calculi: Secondary | ICD-10-CM | POA: Diagnosis not present

## 2024-06-22 NOTE — Progress Notes (Unsigned)
 06/23/2024 8:50 AM   Jerry [name] [birth year] [id number]  Referring provider: No referring provider defined for this encounter.  No chief complaint on file.   HPI:    PMH: Past Medical History:  Diagnosis Date   High cholesterol    History of kidney stones    Hypertension    Migraine    none since ~ 2014 or before (05/23/2018)    Surgical History: Past Surgical History:  Procedure Laterality Date   CORONARY STENT INTERVENTION N/A 05/26/2018   Procedure: CORONARY STENT INTERVENTION;  Surgeon: Jordan, Peter M, MD;  Location: [facility] INVASIVE CV LAB;  Service: Cardiovascular;  Laterality: N/A;   CYSTOSCOPY W/ STONE MANIPULATION  2017   HERNIA REPAIR     INGUINAL HERNIA REPAIR Left 03/07/2016   Procedure: LEFT INGUINAL HERNIA REPAIR;  Surgeon: Donnice Lima, MD;  Location: [facility] OR;  Service: General;  Laterality: Left;   INSERTION OF MESH Left 03/07/2016   Procedure: INSERTION OF MESH;  Surgeon: Donnice Lima, MD;  Location: [facility] OR;  Service: General;  Laterality: Left;   LEFT HEART CATH AND CORONARY ANGIOGRAPHY N/A 05/26/2018   Procedure: LEFT HEART CATH AND CORONARY ANGIOGRAPHY;  Surgeon: Jordan, Peter M, MD;  Location: [facility] INVASIVE CV LAB;  Service: Cardiovascular;  Laterality: N/A;   LEFT HEART CATH AND CORONARY ANGIOGRAPHY N/A 01/16/2022   Procedure: LEFT HEART CATH AND CORONARY ANGIOGRAPHY;  Surgeon: Dann Candyce RAMAN, MD;  Location: [facility] INVASIVE CV LAB;  Service: Cardiovascular;  Laterality: N/A;   LITHOTRIPSY  1990s    Home Medications:  Allergies as of 06/23/2024       Reactions   Alfuzosin    Other Reaction(s): dizzy   Ciprofloxacin Other (See Comments)   Joint and muscle stiffness   Tamsulosin Other (See Comments)   Closed up nasal passages and couldn't breathe   Chloraprep One Step [chlorhexidine  Gluconate] Rash   Sulfamethoxazole Hives, Itching, Rash, Swelling        Medication List        Accurate as of June 22, 2024  8:50 AM. If you have any  questions, ask your nurse or doctor.          acetaminophen  650 MG CR tablet Commonly known as: TYLENOL  Take 650 mg by mouth every 8 (eight) hours as needed for pain.   amLODipine  5 MG tablet Commonly known as: NORVASC  Take 1 tablet (5 mg total) by mouth daily.   aspirin  EC 81 MG tablet Take 1 tablet by mouth daily.   atorvastatin  80 MG tablet Commonly known as: LIPITOR Take 1 tablet (80 mg total) by mouth daily at 6 PM.   atorvastatin  80 MG tablet Commonly known as: LIPITOR Take 1 tablet (80 mg total) by mouth daily.   azelastine  0.1 % nasal spray Commonly known as: ASTELIN  Place 1-2 sprays each nostril twice daily as needed   cetirizine  10 MG tablet Commonly known as: ZyrTEC  Allergy  Take 1 tablet (10 mg total) by mouth daily as needed for allergies.   fluticasone  50 MCG/ACT nasal spray Commonly known as: FLONASE  Place 2 sprays into both nostrils daily.   ketoconazole 2 % cream Commonly known as: NIZORAL Apply 1 application  topically daily as needed (for rosacea).   lisinopril  20 MG tablet Commonly known as: ZESTRIL  Take 1 tablet (20 mg total) by mouth daily.   Magnesium  250 MG Tabs Take 250 mg by mouth 2 (two) times daily.   mometasone 0.1 % ointment Commonly known as: ELOCON Apply 1 application. topically  daily as needed (skin rash).   multivitamin tablet Take 1 tablet by mouth daily.   nitroGLYCERIN  0.4 MG SL tablet Commonly known as: NITROSTAT  Place 1 tablet (0.4 mg total) under the tongue every 5 (five) minutes as needed for chest pain.   omeprazole 40 MG capsule Commonly known as: PRILOSEC Take 40 mg by mouth daily.   silodosin 8 MG Caps capsule Commonly known as: RAPAFLO Take 8 mg by mouth daily with breakfast.        Allergies:  Allergies  Allergen Reactions   Alfuzosin     Other Reaction(s): dizzy   Ciprofloxacin Other (See Comments)    Joint and muscle stiffness   Tamsulosin Other (See Comments)    Closed up nasal passages and  couldn't breathe   Chloraprep One Step [Chlorhexidine  Gluconate] Rash   Sulfamethoxazole Hives, Itching, Rash and Swelling    Family History: Family History  Problem Relation Age of Onset   Hypertension Father    Allergic rhinitis Neg Hx    Angioedema Neg Hx    Asthma Neg Hx    Eczema Neg Hx    Urticaria Neg Hx     Social History:  reports that he quit smoking about 24 years ago. His smoking use included pipe. He has never used smokeless tobacco. He reports that he does not drink alcohol and does not use drugs.  ROS: All other review of systems were reviewed and are negative except what is noted above in HPI  Physical Exam: There were no vitals taken for this visit.  Constitutional:  Alert and oriented, No acute distress. HEENT: [place] AT, moist mucus membranes.  Trachea midline, no masses. Cardiovascular: No clubbing, cyanosis, or edema. Respiratory: Normal respiratory effort, no increased work of breathing. GI: No inguinal hernias GU: Normal phallus. No masses/lesions on penis, testis, scrotum. Prostate ***g smooth no nodules no induration.  Lymph: No cervical or inguinal lymphadenopathy. Skin: No rashes, bruises or suspicious lesions. Neurologic: Grossly intact, no focal deficits, moving all 4 extremities. Psychiatric: Normal mood and affect.  Laboratory Data: Lab Results  Component Value Date   WBC 6.4 01/16/2022   HGB 13.9 01/16/2022   HCT 41.0 01/16/2022   MCV 90.4 01/16/2022   PLT 190 01/16/2022    Lab Results  Component Value Date   CREATININE 1.00 01/16/2022    No results found for: PSA  No results found for: TESTOSTERONE  Lab Results  Component Value Date   HGBA1C 6.0 (H) 01/16/2022    Urinalysis   Pertinent Imaging: ***  Assessment:    Plan:    There are no diagnoses linked to this encounter.  No follow-ups on file.  Garnette CHRISTELLA Shack, MD  [facility] [place]

## 2024-06-23 ENCOUNTER — Other Ambulatory Visit: Payer: Self-pay

## 2024-06-23 ENCOUNTER — Ambulatory Visit: Payer: Medicare Other | Admitting: Urology

## 2024-06-23 VITALS — BP 155/68 | HR 68 | Wt 169.0 lb

## 2024-06-23 DIAGNOSIS — N401 Enlarged prostate with lower urinary tract symptoms: Secondary | ICD-10-CM | POA: Diagnosis not present

## 2024-06-23 DIAGNOSIS — Z125 Encounter for screening for malignant neoplasm of prostate: Secondary | ICD-10-CM | POA: Diagnosis not present

## 2024-06-23 DIAGNOSIS — R300 Dysuria: Secondary | ICD-10-CM

## 2024-06-23 DIAGNOSIS — R310 Gross hematuria: Secondary | ICD-10-CM

## 2024-06-23 DIAGNOSIS — Z87442 Personal history of urinary calculi: Secondary | ICD-10-CM

## 2024-06-23 LAB — URINALYSIS, ROUTINE W REFLEX MICROSCOPIC
Bilirubin, UA: NEGATIVE
Glucose, UA: NEGATIVE
Ketones, UA: NEGATIVE
Leukocytes,UA: NEGATIVE
Nitrite, UA: NEGATIVE
Protein,UA: NEGATIVE
RBC, UA: NEGATIVE
Specific Gravity, UA: 1.015 (ref 1.005–1.030)
Urobilinogen, Ur: 0.2 mg/dL (ref 0.2–1.0)
pH, UA: 7.5 (ref 5.0–7.5)

## 2024-06-23 MED ORDER — SILODOSIN 8 MG PO CAPS: 8.0000 mg | ORAL_CAPSULE | Freq: Every day | ORAL | 11 refills | Status: AC

## 2024-11-05 ENCOUNTER — Ambulatory Visit: Admitting: Internal Medicine

## 2025-06-23 ENCOUNTER — Ambulatory Visit: Admitting: Urology
